# Patient Record
Sex: Female | Born: 1980 | Race: White | Hispanic: No | Marital: Married | Smoking: Never smoker
Health system: Southern US, Community
[De-identification: ages and names within clinical notes are randomized; demographics above are authoritative.]

## PROBLEM LIST (undated history)

## (undated) DIAGNOSIS — IMO0001 Reserved for inherently not codable concepts without codable children: Secondary | ICD-10-CM

## (undated) DIAGNOSIS — L409 Psoriasis, unspecified: Secondary | ICD-10-CM

## (undated) DIAGNOSIS — G43909 Migraine, unspecified, not intractable, without status migrainosus: Secondary | ICD-10-CM

## (undated) HISTORY — DX: Reserved for inherently not codable concepts without codable children: IMO0001

## (undated) HISTORY — PX: NO PAST SURGERIES: SHX2092

## (undated) HISTORY — DX: Psoriasis, unspecified: L40.9

---

## 2010-05-30 ENCOUNTER — Inpatient Hospital Stay (HOSPITAL_COMMUNITY)
Admission: AD | Admit: 2010-05-30 | Payer: BC Managed Care – PPO | Source: Ambulatory Visit | Admitting: Obstetrics and Gynecology

## 2010-06-03 ENCOUNTER — Inpatient Hospital Stay (HOSPITAL_COMMUNITY)
Admission: AD | Admit: 2010-06-03 | Discharge: 2010-06-06 | DRG: 373 | Disposition: A | Discharge status: Home / Self Care | Payer: BC Managed Care – PPO | Source: Ambulatory Visit | Attending: Obstetrics and Gynecology | Admitting: Obstetrics and Gynecology

## 2010-06-03 DIAGNOSIS — O480 Post-term pregnancy: Principal | ICD-10-CM | POA: Diagnosis present

## 2010-06-03 LAB — CBC
MCH: 30.2 pg (ref 26.0–34.0)
MCHC: 34.2 g/dL (ref 30.0–36.0)
RDW: 16.1 % — ABNORMAL HIGH (ref 11.5–15.5)

## 2010-06-04 LAB — RPR: RPR Ser Ql: NONREACTIVE

## 2010-06-05 LAB — CBC
MCHC: 32.9 g/dL (ref 30.0–36.0)
MCV: 89.8 fL (ref 78.0–100.0)
Platelets: 186 10*3/uL (ref 150–400)
RDW: 16.9 % — ABNORMAL HIGH (ref 11.5–15.5)
WBC: 15 10*3/uL — ABNORMAL HIGH (ref 4.0–10.5)

## 2010-09-28 ENCOUNTER — Encounter: Payer: Self-pay | Admitting: *Deleted

## 2010-09-28 DIAGNOSIS — H109 Unspecified conjunctivitis: Secondary | ICD-10-CM | POA: Insufficient documentation

## 2010-09-28 DIAGNOSIS — H5789 Other specified disorders of eye and adnexa: Secondary | ICD-10-CM | POA: Insufficient documentation

## 2010-09-28 NOTE — ED Notes (Signed)
Pt states that her eyes were draining this a.m. and itch. Has gradually gotten worse today.

## 2010-09-29 ENCOUNTER — Emergency Department (HOSPITAL_BASED_OUTPATIENT_CLINIC_OR_DEPARTMENT_OTHER)
Admission: EM | Admit: 2010-09-29 | Discharge: 2010-09-29 | Disposition: A | Discharge status: Home / Self Care | Payer: BC Managed Care – PPO | Attending: Emergency Medicine | Admitting: Emergency Medicine

## 2010-09-29 DIAGNOSIS — H109 Unspecified conjunctivitis: Secondary | ICD-10-CM

## 2010-09-29 MED ORDER — NAPHAZOLINE-PHENIRAMINE 0.025-0.3 % OP SOLN: 1.0000 [drp] | Freq: Four times a day (QID) | OPHTHALMIC | Status: AC

## 2010-09-29 MED ORDER — SULFACETAMIDE SODIUM 10 % OP SOLN: 2.0000 [drp] | OPHTHALMIC | Status: AC

## 2010-09-29 NOTE — ED Provider Notes (Addendum)
History     Chief Complaint  Patient presents with  . Eye Drainage   The history is provided by the patient.  This morning she noticed irritation of both eyes with yellow drainage. There is mild blurred vision when drainage is present. No pain. No rhinorrhea. Nothing makes it better, nothing makes it worse. Symptoms are moderate. She denies sick contacts.  History reviewed. No pertinent past medical history.  History reviewed. No pertinent past surgical history.  History reviewed. No pertinent family history.  History  Substance Use Topics  . Smoking status: Never Smoker   . Smokeless tobacco: Not on file  . Alcohol Use: No    OB History    Grav Para Term Preterm Abortions TAB SAB Ect Mult Living                  Review of Systems  All other systems reviewed and are negative.    Physical Exam  BP 122/75  Pulse 78  Temp(Src) 98.6 F (37 C) (Oral)  Resp 20  Ht 5\' 8"  (1.727 m)  Wt 206 lb (93.441 kg)  BMI 31.32 kg/m2  SpO2 100%  LMP 09/15/2010  Physical Exam  Nursing note and vitals reviewed. Constitutional: She is oriented to person, place, and time. She appears well-developed and well-nourished. No distress.  HENT:  Head: Normocephalic and atraumatic.  Right Ear: External ear normal.  Left Ear: External ear normal.  Nose: Nose normal.  Mouth/Throat: Oropharynx is clear and moist.  Eyes: EOM are normal. Pupils are equal, round, and reactive to light.       Mild bilateral conjunctival injection. Anterior chamber is clear.  Neck: Normal range of motion. Neck supple. No JVD present.  Cardiovascular: Normal rate, regular rhythm and normal heart sounds.   No murmur heard. Pulmonary/Chest: Effort normal and breath sounds normal. She has no wheezes. She has no rales.  Abdominal: Soft. Bowel sounds are normal. She exhibits no mass. There is no tenderness.  Musculoskeletal: Normal range of motion. She exhibits no edema.  Lymphadenopathy:    She has no cervical  adenopathy.  Neurological: She is alert and oriented to person, place, and time. No cranial nerve deficit. Coordination normal.  Skin: Skin is warm and dry. No rash noted.  Psychiatric: She has a normal mood and affect.    ED Course  Procedures  MDM Conjuctivitis - will treat for both allergic and bacterial.      Dione Booze, MD 09/29/10 0159  Dione Booze, MD 11/05/10 1610  Dione Booze, MD 11/07/10 [phone]

## 2011-08-06 ENCOUNTER — Ambulatory Visit (INDEPENDENT_AMBULATORY_CARE_PROVIDER_SITE_OTHER): Payer: BC Managed Care – PPO | Admitting: Family Medicine

## 2011-08-06 VITALS — BP 121/70 | HR 64 | Temp 98.4°F | Resp 16 | Ht 68.0 in | Wt 198.0 lb

## 2011-08-06 DIAGNOSIS — L309 Dermatitis, unspecified: Secondary | ICD-10-CM

## 2011-08-06 DIAGNOSIS — L259 Unspecified contact dermatitis, unspecified cause: Secondary | ICD-10-CM

## 2011-08-06 MED ORDER — TRIAMCINOLONE ACETONIDE 0.1 % EX CREA: TOPICAL_CREAM | Freq: Two times a day (BID) | CUTANEOUS | Status: AC

## 2011-08-06 NOTE — Patient Instructions (Signed)

## 2011-08-06 NOTE — Progress Notes (Signed)
Subjective: For about 3 weeks the patient noticed a rash on right elbow. She does not have any history of eczema or psoriasis. She does not know the cause of the rash.  She is from [place], has been in the [place] for about 8 years, [place].  Objective: Flaky plaque-like lesion on the right elbow. There is a tiny trace not that looks similar on the left elbow. No rash at the base of the sacrum.  Assessment: Eczema(possible psoriasis)  Plan: Triamcinolone cream

## 2012-01-25 ENCOUNTER — Ambulatory Visit (INDEPENDENT_AMBULATORY_CARE_PROVIDER_SITE_OTHER): Payer: BC Managed Care – PPO | Admitting: Family Medicine

## 2012-01-25 VITALS — BP 91/58 | HR 105 | Temp 97.8°F | Resp 18 | Ht 68.0 in | Wt 189.0 lb

## 2012-01-25 DIAGNOSIS — J0100 Acute maxillary sinusitis, unspecified: Secondary | ICD-10-CM

## 2012-01-25 DIAGNOSIS — R059 Cough, unspecified: Secondary | ICD-10-CM

## 2012-01-25 DIAGNOSIS — J111 Influenza due to unidentified influenza virus with other respiratory manifestations: Secondary | ICD-10-CM

## 2012-01-25 DIAGNOSIS — R6889 Other general symptoms and signs: Secondary | ICD-10-CM

## 2012-01-25 DIAGNOSIS — R05 Cough: Secondary | ICD-10-CM

## 2012-01-25 LAB — POCT INFLUENZA A/B
Influenza A, POC: NEGATIVE
Influenza B, POC: NEGATIVE

## 2012-01-25 MED ORDER — BENZONATATE 100 MG PO CAPS: 200.0000 mg | ORAL_CAPSULE | Freq: Two times a day (BID) | ORAL | Status: AC | PRN

## 2012-01-25 MED ORDER — AZITHROMYCIN 250 MG PO TABS: ORAL_TABLET | ORAL | Status: DC

## 2012-01-25 MED ORDER — HYDROCODONE-HOMATROPINE 5-1.5 MG/5ML PO SYRP: 5.0000 mL | ORAL_SOLUTION | Freq: Every evening | ORAL | Status: DC | PRN

## 2012-01-25 NOTE — Progress Notes (Signed)
  [facility]:  Office Visit  Chief Complaint:  Chief Complaint  Patient presents with  . URI    chest congestion/pain fever vomiting fatigue 24 hrs ago    HPI: Melissa [name] is a 31 y.o. female who complains of  1 day history of chest congestion, works at [employer], worked from 6 am to 3:30 am and outside breathing fumes. Has sore throat. Body was weak, msk tired and chills. Subjective fevers, green productive cough.  Has one child 17 mos, has chronic croup. Has tried Advil for fever. Has gotten the flu vaccine week. + nausea and nonbloody vomitus x 1.   History reviewed. No pertinent past medical history. History reviewed. No pertinent past surgical history. History   Social History  . Marital Status: Married    Spouse Name: N/A    Number of Children: N/A  . Years of Education: N/A   Social History Main Topics  . Smoking status: Never Smoker   . Smokeless tobacco: Never Used  . Alcohol Use: No  . Drug Use: No  . Sexually Active: Yes    Birth Control/ Protection: Pill   Other Topics Concern  . None   Social History Narrative  . None   No family history on file. No Known Allergies Prior to Admission medications   Medication Sig Start Date End Date Taking? Authorizing Provider  norethindrone (MICRONOR,CAMILA,ERRIN) 0.35 MG tablet Take 1 tablet by mouth daily.      Historical Provider, MD  triamcinolone cream (KENALOG) 0.1 % Apply topically 2 (two) times daily. 08/06/11 08/05/12  Peyton Najjar, MD     ROS: The patient denies night sweats, unintentional weight loss, chest pain, palpitations, wheezing, dyspnea on exertion, nausea, vomiting, abdominal pain, dysuria, hematuria, melena, numbness, weakness, or tingling.   All other systems have been reviewed and were otherwise negative with the exception of those mentioned in the HPI and as above.    PHYSICAL EXAM: Filed Vitals:   01/25/12 1150  BP: 91/58  Pulse: 105  Temp: 97.8 F (36.6 C)  Resp: 18    Filed Vitals:   01/25/12 1150  Height: 5\' 8"  (1.727 m)  Weight: 189 lb (85.73 kg)   Body mass index is 28.74 kg/(m^2).  General: Alert, no acute distress HEENT:  Normocephalic, atraumatic, oropharynx patent. No exudates, TM nl. + sinus tenderness, right greater than left Cardiovascular:  Sinus tach, Regular rhythm, no rubs murmurs or gallops.  No Carotid bruits, radial pulse intact. No pedal edema.  Respiratory: Clear to auscultation bilaterally.  No wheezes, rales, or rhonchi.  No cyanosis, no use of accessory musculature GI: No organomegaly, abdomen is soft and non-tender, positive bowel sounds.  No masses. Skin: No rashes. Neurologic: Facial musculature symmetric. Psychiatric: Patient is appropriate throughout our interaction. Lymphatic: No cervical lymphadenopathy Musculoskeletal: Gait intact.   LABS: Results for orders placed in visit on 01/25/12  POCT INFLUENZA A/B      Component Value Range   Influenza A, POC Negative     Influenza B, POC Negative       EKG/XRAY:   Primary read interpreted by Dr. Conley Rolls at [facility].   ASSESSMENT/PLAN: Encounter Diagnoses  Name Primary?  . Flu-like symptoms Yes  . Cough   . Sinusitis, acute maxillary    Rx Tessalon perles, hydromet syrup for sxs treatment If no improvement then may take Azithromycin  F/u prn   LE, THAO PHUONG, DO 01/25/2012 12:37 PM

## 2012-05-16 ENCOUNTER — Encounter (HOSPITAL_BASED_OUTPATIENT_CLINIC_OR_DEPARTMENT_OTHER): Payer: Self-pay | Admitting: *Deleted

## 2012-05-16 ENCOUNTER — Emergency Department (HOSPITAL_BASED_OUTPATIENT_CLINIC_OR_DEPARTMENT_OTHER)
Admission: EM | Admit: 2012-05-16 | Discharge: 2012-05-16 | Disposition: A | Discharge status: Home / Self Care | Payer: Worker's Compensation | Attending: Emergency Medicine | Admitting: Emergency Medicine

## 2012-05-16 DIAGNOSIS — Y9289 Other specified places as the place of occurrence of the external cause: Secondary | ICD-10-CM | POA: Insufficient documentation

## 2012-05-16 DIAGNOSIS — Y9389 Activity, other specified: Secondary | ICD-10-CM | POA: Insufficient documentation

## 2012-05-16 DIAGNOSIS — Z79899 Other long term (current) drug therapy: Secondary | ICD-10-CM | POA: Insufficient documentation

## 2012-05-16 DIAGNOSIS — X503XXA Overexertion from repetitive movements, initial encounter: Secondary | ICD-10-CM | POA: Insufficient documentation

## 2012-05-16 DIAGNOSIS — M5431 Sciatica, right side: Secondary | ICD-10-CM

## 2012-05-16 DIAGNOSIS — M5430 Sciatica, unspecified side: Secondary | ICD-10-CM | POA: Insufficient documentation

## 2012-05-16 MED ORDER — OXYCODONE-ACETAMINOPHEN 5-325 MG PO TABS: 1.0000 | ORAL_TABLET | ORAL | Status: DC | PRN

## 2012-05-16 NOTE — ED Notes (Signed)
Pt works for [employer]- hurt her low back 2 days ago while lifting pt- has been evaluated but tonight has increasing pain- taking flexeril and voltaren without relief

## 2012-05-16 NOTE — ED Notes (Signed)
Pt given worker comp forms by Press photographer JB prior to discharge

## 2012-05-16 NOTE — ED Notes (Signed)
Pt reports no lost of control bowel and bladder

## 2012-05-16 NOTE — ED Provider Notes (Signed)
History  This chart was scribed for Carleene Cooper III, MD by Shari Heritage, ED Scribe. The patient was seen in room MH03/MH03. Patient's care was started at 1952.   CSN: [id number]  Arrival date & time 05/16/12  1916   First MD Initiated Contact with Patient 05/16/12 1952      Chief Complaint  Patient presents with  . Back Pain    The history is provided by the patient. A language interpreter was used.     HPI Comments: Melissa [name] is a 32 y.o. female who presents to the Emergency Department complaining of worsening, constant right lower back pain that radiates down her right leg onset 2 days ago. 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 Patient has been evaluated for this pain, but states that today it has worsened. She has been taking Flexeril 10 mg and Voltaren 75 mg without relief. She says that she was cleared to continue working, but patient is unable to stand for the periods of time required for her job duties. Patient also states that sitting for more than 15 minutes also increases pain. There is no bowel or bladder incontinence. Patient has no history of chronic back pain or injury. She has no surgical history. She is not allergic to any medicines. Patient does not smoke or use alcohol.    History reviewed. No pertinent past medical history.  History reviewed. No pertinent past surgical history.  No family history on file.  History  Substance Use Topics  . Smoking status: Never Smoker   . Smokeless tobacco: Never Used  . Alcohol Use: No    OB History   Grav Para Term Preterm Abortions TAB SAB Ect Mult Living                  Review of Systems  Gastrointestinal:       Negative for bowel incontinence.  Genitourinary:       Negative for bladder incontinence.  Musculoskeletal: Positive for back pain.    Allergies  Review of patient's allergies indicates no known  allergies.  Home Medications   Current Outpatient Rx  Name  Route  Sig  Dispense  Refill  . cyclobenzaprine (FLEXERIL) 10 MG tablet   Oral   Take 10 mg by mouth at bedtime as needed for muscle spasms.         . diclofenac (VOLTAREN) 75 MG EC tablet   Oral   Take 75 mg by mouth every 6 (six) hours.         . norethindrone (MICRONOR,CAMILA,ERRIN) 0.35 MG tablet   Oral   Take 1 tablet by mouth daily.           Marland Kitchen triamcinolone cream (KENALOG) 0.1 %   Topical   Apply topically 2 (two) times daily.   30 g   0   . azithromycin (ZITHROMAX) 250 MG tablet      Take 2 tabs po now and then 1 tab po daily for the next 4 days.   6 tablet   0   . HYDROcodone-homatropine (HYCODAN) 5-1.5 MG/5ML syrup   Oral   Take 5 mLs by mouth at bedtime as needed for cough.   120 mL   0     Triage Vitals: BP 113/63  Pulse 76  Temp(Src) 97.9 F (36.6 C) (Oral)  Resp 18  SpO2 99%  LMP 05/02/2012  Physical Exam  Constitutional: She  is oriented to person, place, and time. She appears well-developed and well-nourished.  HENT:  Head: Normocephalic and atraumatic.  Neck: Normal range of motion. Neck supple.  Cardiovascular: Normal rate, regular rhythm and normal heart sounds.   Pulmonary/Chest: Effort normal and breath sounds normal.  Abdominal: Soft. Bowel sounds are normal. There is no tenderness.  Musculoskeletal: Normal range of motion. She exhibits tenderness.  Localizes pain to right sacroiliac joint. No palpable deformity. No numbness or deformity of the right leg.   Neurological: She is alert and oriented to person, place, and time.  Skin: Skin is warm and dry.  Psychiatric: She has a normal mood and affect. Her behavior is normal.    ED Course  Procedures (including critical care time) DIAGNOSTIC STUDIES: Oxygen Saturation is 99% on room air, normal by my interpretation.    COORDINATION OF CARE: 8:02 PM- Patient informed of current plan for treatment and evaluation and  agrees with plan at this time.   Pt appears to have sciatica.  No indication for imaging.  Continue Flexeril, change to Percocet q4h prn pain.  No work for 3 days.  Advised to followup with Dr. Warrick Parisian, her PCP, in 2-3 days to see if she can return to work.       1. Sciatica neuralgia, right    I personally performed the services described in this documentation, which was scribed in my presence. The recorded information has been reviewed and is accurate.  Osvaldo Human, MD      Carleene Cooper III, MD 05/16/12 2013

## 2012-07-13 ENCOUNTER — Other Ambulatory Visit: Payer: Self-pay | Admitting: Family Medicine

## 2012-07-13 DIAGNOSIS — R102 Pelvic and perineal pain: Secondary | ICD-10-CM

## 2012-07-14 ENCOUNTER — Ambulatory Visit
Admission: RE | Admit: 2012-07-14 | Discharge: 2012-07-14 | Disposition: A | Discharge status: Home / Self Care | Payer: BC Managed Care – PPO | Source: Ambulatory Visit | Attending: Family Medicine | Admitting: Family Medicine

## 2012-07-14 DIAGNOSIS — R102 Pelvic and perineal pain: Secondary | ICD-10-CM

## 2012-11-17 LAB — OB RESULTS CONSOLE ABO/RH: RH Type: NEGATIVE

## 2012-11-17 LAB — OB RESULTS CONSOLE HIV ANTIBODY (ROUTINE TESTING): HIV: NONREACTIVE

## 2012-11-17 LAB — OB RESULTS CONSOLE GC/CHLAMYDIA
Chlamydia: NEGATIVE
Gonorrhea: NEGATIVE

## 2012-11-17 LAB — OB RESULTS CONSOLE RUBELLA ANTIBODY, IGM: RUBELLA: IMMUNE

## 2012-11-17 LAB — OB RESULTS CONSOLE RPR: RPR: NONREACTIVE

## 2012-11-17 LAB — OB RESULTS CONSOLE HEPATITIS B SURFACE ANTIGEN: Hepatitis B Surface Ag: NEGATIVE

## 2012-11-17 LAB — OB RESULTS CONSOLE ANTIBODY SCREEN: Antibody Screen: NEGATIVE

## 2013-03-03 NOTE — L&D Delivery Note (Signed)
Delivery Note  SVD viable female Apgars 689m9 over 2nd degree ML lac.  Placenta delivered spontaneously intact with 3VC and sent to pathology Repair with 2-0 Chromic with good support and hemostasis noted and R/V exam confirms.  PH art was not done.  [place] cord blood was not done.  Mother and baby were doing well.  EBL 300cc  Candice Campavid Tifanie Gardiner, MD

## 2013-05-25 LAB — OB RESULTS CONSOLE GBS: GBS: NEGATIVE

## 2013-06-16 ENCOUNTER — Encounter (HOSPITAL_COMMUNITY): Payer: Self-pay | Admitting: *Deleted

## 2013-06-16 ENCOUNTER — Telehealth (HOSPITAL_COMMUNITY): Payer: Self-pay | Admitting: *Deleted

## 2013-06-16 NOTE — Telephone Encounter (Signed)
Preadmission screen  

## 2013-06-18 ENCOUNTER — Inpatient Hospital Stay (HOSPITAL_COMMUNITY)
Admission: RE | Admit: 2013-06-18 | Discharge: 2013-06-20 | DRG: 775 | Disposition: A | Discharge status: Home / Self Care | Payer: BC Managed Care – PPO | Source: Ambulatory Visit | Attending: Obstetrics and Gynecology | Admitting: Obstetrics and Gynecology

## 2013-06-18 ENCOUNTER — Encounter (HOSPITAL_COMMUNITY): Payer: Self-pay

## 2013-06-18 DIAGNOSIS — O358XX0 Maternal care for other (suspected) fetal abnormality and damage, not applicable or unspecified: Secondary | ICD-10-CM | POA: Diagnosis present

## 2013-06-18 LAB — TYPE AND SCREEN
ABO/RH(D): A NEG
ANTIBODY SCREEN: POSITIVE
DAT, IgG: NEGATIVE

## 2013-06-18 LAB — CBC
HCT: 37.4 % (ref 36.0–46.0)
Hemoglobin: 13.2 g/dL (ref 12.0–15.0)
MCH: 31.1 pg (ref 26.0–34.0)
MCHC: 35.3 g/dL (ref 30.0–36.0)
MCV: 88 fL (ref 78.0–100.0)
PLATELETS: 241 10*3/uL (ref 150–400)
RBC: 4.25 MIL/uL (ref 3.87–5.11)
RDW: 13 % (ref 11.5–15.5)
WBC: 15.2 10*3/uL — ABNORMAL HIGH (ref 4.0–10.5)

## 2013-06-18 LAB — RPR

## 2013-06-18 MED ORDER — LACTATED RINGERS IV SOLN: 500.0000 mL | INTRAVENOUS | Status: DC | PRN

## 2013-06-18 MED ORDER — WITCH HAZEL-GLYCERIN EX PADS: 1.0000 "application " | MEDICATED_PAD | CUTANEOUS | Status: DC | PRN

## 2013-06-18 MED ORDER — ONDANSETRON HCL 4 MG/2ML IJ SOLN: 4.0000 mg | INTRAMUSCULAR | Status: DC | PRN

## 2013-06-18 MED ORDER — ONDANSETRON HCL 4 MG/2ML IJ SOLN: 4.0000 mg | Freq: Four times a day (QID) | INTRAMUSCULAR | Status: DC | PRN

## 2013-06-18 MED ORDER — LIDOCAINE HCL (PF) 1 % IJ SOLN
30.0000 mL | INTRAMUSCULAR | Status: AC | PRN
  Administered 2013-06-18: 30 mL via SUBCUTANEOUS
  Filled 2013-06-18: qty 30

## 2013-06-18 MED ORDER — FLEET ENEMA 7-19 GM/118ML RE ENEM: 1.0000 | ENEMA | RECTAL | Status: DC | PRN

## 2013-06-18 MED ORDER — TETANUS-DIPHTH-ACELL PERTUSSIS 5-2.5-18.5 LF-MCG/0.5 IM SUSP: 0.5000 mL | Freq: Once | INTRAMUSCULAR | Status: DC

## 2013-06-18 MED ORDER — LANOLIN HYDROUS EX OINT: TOPICAL_OINTMENT | CUTANEOUS | Status: DC | PRN

## 2013-06-18 MED ORDER — CITRIC ACID-SODIUM CITRATE 334-500 MG/5ML PO SOLN: 30.0000 mL | ORAL | Status: DC | PRN

## 2013-06-18 MED ORDER — ACETAMINOPHEN 325 MG PO TABS: 650.0000 mg | ORAL_TABLET | ORAL | Status: DC | PRN

## 2013-06-18 MED ORDER — SENNOSIDES-DOCUSATE SODIUM 8.6-50 MG PO TABS
2.0000 | ORAL_TABLET | ORAL | Status: DC
Start: 2013-06-19 — End: 2013-06-20
  Administered 2013-06-19 (×2): 2 via ORAL
  Filled 2013-06-18 (×2): qty 2

## 2013-06-18 MED ORDER — LACTATED RINGERS IV SOLN: INTRAVENOUS | Status: DC

## 2013-06-18 MED ORDER — SIMETHICONE 80 MG PO CHEW: 80.0000 mg | CHEWABLE_TABLET | ORAL | Status: DC | PRN

## 2013-06-18 MED ORDER — ONDANSETRON HCL 4 MG PO TABS: 4.0000 mg | ORAL_TABLET | ORAL | Status: DC | PRN

## 2013-06-18 MED ORDER — IBUPROFEN 600 MG PO TABS: 600.0000 mg | ORAL_TABLET | Freq: Four times a day (QID) | ORAL | Status: DC | PRN

## 2013-06-18 MED ORDER — IBUPROFEN 600 MG PO TABS
600.0000 mg | ORAL_TABLET | Freq: Four times a day (QID) | ORAL | Status: DC
  Administered 2013-06-18 – 2013-06-19 (×6): 600 mg via ORAL
  Filled 2013-06-18 (×8): qty 1

## 2013-06-18 MED ORDER — MEDROXYPROGESTERONE ACETATE 150 MG/ML IM SUSP: 150.0000 mg | INTRAMUSCULAR | Status: DC | PRN

## 2013-06-18 MED ORDER — OXYTOCIN BOLUS FROM INFUSION: 500.0000 mL | INTRAVENOUS | Status: DC

## 2013-06-18 MED ORDER — DIBUCAINE 1 % RE OINT: 1.0000 "application " | TOPICAL_OINTMENT | RECTAL | Status: DC | PRN

## 2013-06-18 MED ORDER — MEASLES, MUMPS & RUBELLA VAC ~~LOC~~ INJ
0.5000 mL | INJECTION | Freq: Once | SUBCUTANEOUS | Status: DC
Start: 2013-06-19 — End: 2013-06-20
  Filled 2013-06-18: qty 0.5

## 2013-06-18 MED ORDER — OXYCODONE-ACETAMINOPHEN 5-325 MG PO TABS: 1.0000 | ORAL_TABLET | ORAL | Status: DC | PRN

## 2013-06-18 MED ORDER — DIPHENHYDRAMINE HCL 25 MG PO CAPS: 25.0000 mg | ORAL_CAPSULE | Freq: Four times a day (QID) | ORAL | Status: DC | PRN

## 2013-06-18 MED ORDER — ZOLPIDEM TARTRATE 5 MG PO TABS: 5.0000 mg | ORAL_TABLET | Freq: Every evening | ORAL | Status: DC | PRN

## 2013-06-18 MED ORDER — BENZOCAINE-MENTHOL 20-0.5 % EX AERO
1.0000 "application " | INHALATION_SPRAY | CUTANEOUS | Status: DC | PRN
  Administered 2013-06-18: 1 via TOPICAL
  Filled 2013-06-18: qty 56

## 2013-06-18 MED ORDER — OXYTOCIN 40 UNITS IN LACTATED RINGERS INFUSION - SIMPLE MED
62.5000 mL/h | INTRAVENOUS | Status: DC
  Administered 2013-06-18: 62.5 mL/h via INTRAVENOUS
  Filled 2013-06-18: qty 1000

## 2013-06-18 MED ORDER — PRENATAL MULTIVITAMIN CH
1.0000 | ORAL_TABLET | Freq: Every day | ORAL | Status: DC
  Administered 2013-06-18: 1 via ORAL
  Filled 2013-06-18 (×2): qty 1

## 2013-06-18 NOTE — H&P (Addendum)
Melissa [name] is a 33 y.o. female presenting for SROM at 0400 and labor sxs at 6am. Was 8cm on presentation   Pregnancy complicated by renal fetal pylectasis with otherwise normal US.  3 days ago had US showing worsening of left side to 24 mm and right with 11mm and was scheduled for induction today but presented in labor.  GBS -. History OB History   Grav Para Term Preterm Abortions TAB SAB Ect Mult Living   2 1 1       1      Past Medical History  Diagnosis Date  . Medical history non-contributory    Past Surgical History  Procedure Laterality Date  . No past surgeries     Family History: family history includes Cancer in her father and mother; Hypertension in her brother and maternal grandmother; Thyroid disease in her father; Ulcers in her brother and father. Social History:  reports that she has never smoked. She has never used smokeless tobacco. She reports that she does not drink alcohol or use illicit drugs.   Prenatal Transfer Tool  Maternal Diabetes: No Genetic Screening: Normal Maternal Ultrasounds/Referrals: Normal Fetal Ultrasounds or other Referrals:  Other: Bilateral renal pylectasis Maternal Substance Abuse:  No Significant Maternal Medications:  None Significant Maternal Lab Results:  None Other Comments:  None  ROS    Blood pressure 119/84, pulse 88, temperature 98.2 F (36.8 C), temperature source Oral, resp. rate 20, height 5\' 8"  (1.727 m), weight 106.142 kg (234 lb), last menstrual period 09/13/2012. Exam Physical Exam  8/c/-3 Prenatal labs: ABO, Rh: A/Negative/-- (09/17 0000) Antibody: Negative (09/17 0000) Rubella: Immune (09/17 0000) RPR: Nonreactive (09/17 0000)  HBsAg: Negative (09/17 0000)  HIV: Non-reactive (09/17 0000)  GBS: Negative (03/25 0000)   Assessment/Plan: IUP at term in active labor anticpate SVD Renal pylectasis with worsening of left side (to 24mm)  Melissa [name] 06/18/2013, 6:38 AM

## 2013-06-18 NOTE — Lactation Note (Signed)
This note was copied from the chart of Melissa [name]. Lactation Consultation Note  Patient Name: Melissa [name] ZOXWR'UToday's Date: 06/18/2013 Reason for consult: Initial assessment Lactation visit made. Baby is skin to skin with mother. She reports he fed well after birth and she is trying to latch him now. Baby is very sleepy and turning away from the breast when LC tried to assist mother. Patient reports a history of delayed and low milk supply with last baby resulting in the baby requiring supplementation to gain weight. Mother breast fed, pumped and added formula for 6 months. Mother was taught hand expression and has colostrum. Mother to call RN/ LC when baby awakes for assessment of the latch. Mom encouraged to feed baby w/feeding cues and discussed signs of cues. Discussed the option of setting a breast pump to stimulate milk supply if baby not feeding well. Mother instructed to ask RN for pump if desires. Mother informed of post-discharge support and given phone number to the lactation department, including services for phone call assistance; out-patient appointments; and breastfeeding support group. List of other breastfeeding resources in the community given in the handout. Encouraged mother to call for problems or concerns related to breastfeeding. LC will follow due to history of low milk supply.  Maternal Data Formula Feeding for Exclusion: No  Feeding Feeding Type: Breast Fed Length of feed: 10 min  LATCH Score/Interventions                      Lactation Tools Discussed/Used     Consult Status Consult Status: Follow-up Follow-up type: In-patient    Melissa [name] 06/18/2013, 4:14 PM

## 2013-06-18 NOTE — Lactation Note (Signed)
This note was copied from the chart of Melissa Alyiah Ogburn. Lactation Consultation Note Follow up visit at 14 hours of age.  Mom and FOB giving baby bottle of 15mls that mom just pumped with their own bottle.  Encouraged to allow baby wide open mouth with flanged lips to encourage breast feeding.   Mom has older child that didn't get enough milk and she had to supplement, so she want to bottle some to make sure baby gets some.  Discussed pumping and encouraged mom to latch baby.  Demonstrated and assisted with burping baby and then mom fed with syringe/finger 1 ml  Colostrum.  Mom to call for assist as needed.    Patient Name: Melissa [name] Reason for consult: Follow-up assessment   Maternal Data    Feeding Feeding Type: Bottle Fed - Breast Milk Length of feed: 5 min  LATCH Score/Interventions                      Lactation Tools Discussed/Used     Consult Status Consult Status: Follow-up Date: 06/19/13 Follow-up type: In-patient    Melissa [name] [name], 8:47 PM

## 2013-06-19 LAB — CBC
HEMATOCRIT: 33.2 % — AB (ref 36.0–46.0)
Hemoglobin: 11 g/dL — ABNORMAL LOW (ref 12.0–15.0)
MCH: 29.9 pg (ref 26.0–34.0)
MCHC: 33.1 g/dL (ref 30.0–36.0)
MCV: 90.2 fL (ref 78.0–100.0)
Platelets: 189 10*3/uL (ref 150–400)
RBC: 3.68 MIL/uL — ABNORMAL LOW (ref 3.87–5.11)
RDW: 13.4 % (ref 11.5–15.5)
WBC: 10.8 10*3/uL — AB (ref 4.0–10.5)

## 2013-06-19 NOTE — Progress Notes (Signed)
Post Partum Day 1 Subjective: no complaints, up ad lib and voiding  Objective: Blood pressure 101/69, pulse 64, temperature 97.9 F (36.6 C), temperature source Oral, resp. rate 18, height 5\' 8"  (1.727 m), weight 106.142 kg (234 lb), last menstrual period 09/13/2012, SpO2 98.00%, unknown if currently breastfeeding.  Physical Exam:  General: alert, cooperative, appears stated age and no distress Lochia: appropriate Uterine Fundus: firm Incision: healing well DVT Evaluation: No evidence of DVT seen on physical exam.   Recent Labs  06/18/13 0540 06/19/13 0550  HGB 13.2 11.0*  HCT 37.4 33.2*    Assessment/Plan: Plan for discharge tomorrow, Breastfeeding and Circumcision prior to discharge if ok with Peds (US and urology today)   LOS: 1 day   Turner DanielsDavid C Tomi Grandpre 06/19/2013, 10:16 AM

## 2013-06-20 ENCOUNTER — Inpatient Hospital Stay (HOSPITAL_COMMUNITY)
Admission: AD | Admit: 2013-06-20 | Payer: BC Managed Care – PPO | Source: Ambulatory Visit | Admitting: Obstetrics and Gynecology

## 2013-06-20 NOTE — Lactation Note (Signed)
This note was copied from the chart of Melissa Dessire Fanara. Lactation Consultation Note Mom stated baby is finally getting the hang of feeding and fed great on the last feeding. BF 30 min. On one side, mom pumped the other and gave 5ml of colostrum. Denied pain during feeding, feeling relieved that baby is eating now. Encouraged to call for assistance if needed. Patient Name: Melissa [name] WUJWJ'XToday's Date: 06/20/2013     Maternal Data    Feeding Feeding Type: Breast Fed Length of feed: 30 min  [facility] Score/Interventions                      Lactation Tools Discussed/Used     Consult Status      Melissa [name] 06/20/2013, 1:55 AM

## 2013-06-20 NOTE — Discharge Summary (Signed)
Obstetric Discharge Summary Reason for Admission: onset of labor Prenatal Procedures: ultrasound Intrapartum Procedures: spontaneous vaginal delivery Postpartum Procedures: none Complications-Operative and Postpartum: 2 degree perineal laceration Hemoglobin  Date Value Ref Range Status  06/19/2013 11.0* 12.0 - 15.0 g/dL Final     HCT  Date Value Ref Range Status  06/19/2013 33.2* 36.0 - 46.0 % Final    Physical Exam:  General: alert and cooperative Lochia: appropriate Uterine Fundus: firm Incision: healing well DVT Evaluation: No evidence of DVT seen on physical exam. Negative Homan's sign. No cords or calf tenderness. No significant calf/ankle edema.  Discharge Diagnoses: Term Pregnancy-delivered  Discharge Information: Date: 06/20/2013 Activity: pelvic rest Diet: routine Medications: PNV and Ibuprofen Condition: stable Instructions: refer to practice specific booklet Discharge to: home   Newborn Data: Live born female  Birth Weight: 8 lb 13.8 oz (4020 g) APGAR: 9, 9  Home with mother.  Judith BlonderCarol G Niara Bunker 06/20/2013, 8:36 AM

## 2014-01-02 ENCOUNTER — Encounter (HOSPITAL_COMMUNITY): Payer: Self-pay

## 2014-01-29 ENCOUNTER — Ambulatory Visit (INDEPENDENT_AMBULATORY_CARE_PROVIDER_SITE_OTHER): Payer: BC Managed Care – PPO | Admitting: Family Medicine

## 2014-01-29 ENCOUNTER — Ambulatory Visit (INDEPENDENT_AMBULATORY_CARE_PROVIDER_SITE_OTHER): Payer: BC Managed Care – PPO

## 2014-01-29 VITALS — BP 106/70 | HR 60 | Temp 97.9°F | Resp 16 | Ht 68.0 in | Wt 228.0 lb

## 2014-01-29 DIAGNOSIS — M779 Enthesopathy, unspecified: Secondary | ICD-10-CM

## 2014-01-29 DIAGNOSIS — M778 Other enthesopathies, not elsewhere classified: Secondary | ICD-10-CM

## 2014-01-29 DIAGNOSIS — M6588 Other synovitis and tenosynovitis, other site: Secondary | ICD-10-CM

## 2014-01-29 DIAGNOSIS — M79671 Pain in right foot: Secondary | ICD-10-CM

## 2014-01-29 MED ORDER — PREDNISONE 20 MG PO TABS: ORAL_TABLET | ORAL | Status: DC

## 2014-01-29 MED ORDER — MELOXICAM 15 MG PO TABS: 15.0000 mg | ORAL_TABLET | Freq: Every day | ORAL | Status: DC

## 2014-01-29 NOTE — Patient Instructions (Signed)

## 2014-01-29 NOTE — Progress Notes (Signed)
    MRN: [id number] DOB: [birth year]  Subjective:   Melissa [name] is a 33 y.o. female presenting for 6 day history of worsening right foot pain. Pain is  5/10 on pain scale, located in mid-dorsal foot, constant, intermittently radiates up to her shin. Patient has had difficulty bearing weight and ambulating secondary to pain, mild swelling. She has had some relief from using ibuprofen 800mg  and icing daily. Denies trauma, erythema, wounds, decreased ROM or sensation. 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 Reports that 2 years ago she experienced similar pain in left foot, was told by her PCP to purchase ortho shoes, patient invested $300 in tailored shoes which help resolve her left foot pain. Denies any other aggravating or relieving factors, no other questions or concerns.  Prior to Admission medications   Medication Sig Start Date End Date Taking? Authorizing Provider  Prenatal Vit-Fe Fumarate-FA (PRENATAL MULTIVITAMIN) TABS tablet Take 1 tablet by mouth daily at 12 noon.   Yes Historical Provider, MD   Allergies  Allergen Reactions  . Clindamycin/Lincomycin     Hives and reddness over her body   Denies past medical history.  Past Surgical History  Procedure Laterality Date  . No past surgeries     ROS As in subjective.   Objective:   Vitals: BP 106/70 mmHg  Pulse 60  Temp(Src) 97.9 F (36.6 C) (Oral)  Resp 16  Ht 5\' 8"  (1.727 m)  Wt 228 lb (103.42 kg)  BMI 34.68 kg/m2  SpO2 99%  LMP 01/19/2014  Physical Exam  Constitutional: She is oriented to person, place, and time and well-developed, well-nourished, and in no distress.  Cardiovascular: Normal rate.   Pulmonary/Chest: Effort normal. No respiratory distress.  Musculoskeletal: Normal range of motion. She exhibits tenderness (over 2-4th metatarsals on dorsal aspect of right foot). She exhibits no edema.  Neurological: She is alert and oriented to person, place, and  time.  Skin: Skin is warm and dry. No rash noted. She is not diaphoretic. No erythema.   [facility] reading (PRIMARY) by  Dr. Milus GlazierLauenstein and PA-Kyler Germer. X-ray of right foot: Normal bony landmarks, joint space. No evidence of fracture or dislocation. Soft tissue unremarkable.  Assessment and Plan :   1. Right foot pain 2. Extensor tendonitis of foot - DG Foot Complete Right; Future - meloxicam (MOBIC) 15 MG tablet; Take 1 tablet (15 mg total) by mouth daily.  Dispense: 30 tablet; Refill: 1 - predniSONE (DELTASONE) 20 MG tablet; Take 2 tablets by mouth daily.  Dispense: 10 tablet; Refill: 0 - Reassured patient that there is no evidence of stress fracture. - will attempt trial of NSAID, prednisone, advised patient to let her supervisor know of her foot pain and modify work activity if possible - return to clinic if symptoms worsen, fail to resolve or as needed   Melissa BambergMario Letisha Yera, PA-C [facility] [phone] 01/29/2014 2:00 PM

## 2014-02-01 ENCOUNTER — Telehealth: Payer: Self-pay

## 2014-02-01 NOTE — Telephone Encounter (Signed)
[facility] called to request medical records for patients last ov, labs and x-ray report.   Called patient to complete ROI forms - She will provide one today.   Dr. Karna DupesF. Wong  -  Fax number is  [phone]  Phone  [phone]

## 2014-02-02 NOTE — Telephone Encounter (Signed)
Request received. Medical records will process.

## 2014-05-15 ENCOUNTER — Encounter: Payer: Self-pay | Admitting: Diagnostic Neuroimaging

## 2014-05-15 ENCOUNTER — Ambulatory Visit (INDEPENDENT_AMBULATORY_CARE_PROVIDER_SITE_OTHER): Payer: BLUE CROSS/BLUE SHIELD | Admitting: Diagnostic Neuroimaging

## 2014-05-15 VITALS — BP 114/80 | HR 76 | Wt 228.0 lb

## 2014-05-15 DIAGNOSIS — R519 Headache, unspecified: Secondary | ICD-10-CM

## 2014-05-15 DIAGNOSIS — G43109 Migraine with aura, not intractable, without status migrainosus: Secondary | ICD-10-CM | POA: Diagnosis not present

## 2014-05-15 DIAGNOSIS — H539 Unspecified visual disturbance: Secondary | ICD-10-CM

## 2014-05-15 DIAGNOSIS — R51 Headache: Secondary | ICD-10-CM | POA: Diagnosis not present

## 2014-05-15 NOTE — Patient Instructions (Signed)

## 2014-05-15 NOTE — Progress Notes (Signed)
[facility]  PATIENT: Melissa [name] Dech DOB: 01/21/1981  REFERRING CLINICIAN: Karna DupesF Wong, MD HISTORY FROM: patient (and EPIC chart review) REASON FOR VISIT: new consult    HISTORICAL  CHIEF COMPLAINT:  Chief Complaint  Patient presents with  . New Evaluation    migraine with visual disturbances in right eye    HISTORY OF PRESENT ILLNESS:   34 year old right-handed female here for consultation at request of Dr. Modesto CharonWong, for evaluation of new onset visual disturbance, headache, neck numbness.  January 2016 patient started a new work schedule, waking up at 3 in the morning, starting work at 4 AM. Patient has 2 small children at home and she typically goes to bed late in the evening. By February 2016, she had one episode of right eye blurred vision, seeing sparkling flashing shapes and lites. Symptoms lasted for 30 minutes and then resolve.  Last surgery patient had another episode, similar with right eye blurred vision, seeing rainbow colors, shapes, triangles, none left eye pressure, then frontal headache with nausea and photophobia and phonophobia. Symptoms last for a few hours and then resolved.  Patient has been to PCP and ophthalmology who found no specific cause. Consideration of migraine has been made and patient referred to me for further evaluation.  Overall patient has had some weight gain over the past 5 years, ranging from 178 up to 228 pounds currently. Generally she has decreased energy. No prior history of migraine. No family history of migraine. No other specific triggering her) factors.    REVIEW OF SYSTEMS: Full 14 system review of systems performed and notable only for weight gain fatigue blurred vision loss of vision eye pain headache shiftwork not asleep decreased energy.  ALLERGIES: Allergies  Allergen Reactions  . Clindamycin/Lincomycin     Hives and reddness over her body    HOME MEDICATIONS: Outpatient Prescriptions Prior to Visit    Medication Sig Dispense Refill  . meloxicam (MOBIC) 15 MG tablet Take 1 tablet (15 mg total) by mouth daily. 30 tablet 1  . predniSONE (DELTASONE) 20 MG tablet Take 2 tablets by mouth daily. 10 tablet 0  . Prenatal Vit-Fe Fumarate-FA (PRENATAL MULTIVITAMIN) TABS tablet Take 1 tablet by mouth daily at 12 noon.     No facility-administered medications prior to visit.    PAST MEDICAL HISTORY: Past Medical History  Diagnosis Date  . Healthy adult     PAST SURGICAL HISTORY: Past Surgical History  Procedure Laterality Date  . No past surgeries      FAMILY HISTORY: Family History  Problem Relation Age of Onset  . Cancer Mother     kidney  . Thyroid disease Father   . Ulcers Father   . Cancer Father     thyroid  . Hypertension Brother   . Ulcers Brother   . Hypertension Maternal Grandmother     SOCIAL HISTORY:  History   Social History  . Marital Status: Married    Spouse Name: Velid  . Number of Children: 2  . Years of Education: Bachelors    Occupational History  . Not on file.   Social History Main Topics  . Smoking status: Never Smoker   . Smokeless tobacco: Never Used  . Alcohol Use: No  . Drug Use: No  . Sexual Activity: Yes    Birth Control/ Protection: IUD   Other Topics Concern  . Not on file   Social History Narrative   Lives with husband, mom and two kids (boy and girl)  Drinks about 4 cups of coffee a day     PHYSICAL EXAM  Filed Vitals:   05/15/14 1459  BP: 114/80  Pulse: 76  Weight: 228 lb (103.42 kg)    Body mass index is 34.68 kg/(m^2).  No exam data present  No flowsheet data found.  GENERAL EXAM: Patient is in no distress; well developed, nourished and groomed; neck is supple  CARDIOVASCULAR: Regular rate and rhythm, no murmurs, no carotid bruits  NEUROLOGIC: MENTAL STATUS: awake, alert, oriented to person, place and time, recent and remote memory intact, normal attention and concentration, language fluent,  comprehension intact, naming intact, fund of knowledge appropriate CRANIAL NERVE: no papilledema on fundoscopic exam, pupils equal and reactive to light, visual fields full to confrontation, extraocular muscles intact, no nystagmus, facial sensation and strength symmetric, hearing intact, palate elevates symmetrically, uvula midline, shoulder shrug symmetric, tongue midline. MOTOR: normal bulk and tone, full strength in the BUE, BLE SENSORY: normal and symmetric to light touch, pinprick, temperature, vibration  COORDINATION: finger-nose-finger, fine finger movements normal REFLEXES: deep tendon reflexes present and symmetric GAIT/STATION: narrow based gait; able to walk on toes, heels and tandem; romberg is negative    DIAGNOSTIC DATA (LABS, IMAGING, TESTING) - I reviewed patient records, labs, notes, testing and imaging myself where available.  Lab Results  Component Value Date   WBC 10.8* 06/19/2013   HGB 11.0* 06/19/2013   HCT 33.2* 06/19/2013   MCV 90.2 06/19/2013   PLT 189 06/19/2013   No results found for: NA, K, CL, CO2, GLUCOSE, BUN, CREATININE, CALCIUM, PROT, ALBUMIN, AST, ALT, ALKPHOS, BILITOT, GFRNONAA, GFRAA No results found for: CHOL, HDL, LDLCALC, LDLDIRECT, TRIG, CHOLHDL No results found for: ZOXW9U No results found for: VITAMINB12 No results found for: TSH     ASSESSMENT AND PLAN  34 y.o. year old female here with new onset vision changes and new onset headache. Unclear etiology, but could be migraine vs secondary HA. Neuro exam unremarkable.  Ddx: migraine headache vs secondary headache (CNS mass, vascular, autoimmune/inflamm)  PLAN: - MRI brain - monitor sxs; may consider topiramate at next visit it HA/sxs increase - migraine education, headache diary, HA triggers reviewed  Orders Placed This Encounter  Procedures  . MR Brain W Wo Contrast    Return in about 6 weeks (around 06/26/2014).    Suanne Marker, MD 05/15/2014, 4:06 PM Certified in  Neurology, Neurophysiology and Neuroimaging  [facility] [address], [address] [place], [place] [address] [phone]

## 2014-05-18 ENCOUNTER — Ambulatory Visit (INDEPENDENT_AMBULATORY_CARE_PROVIDER_SITE_OTHER): Payer: BLUE CROSS/BLUE SHIELD

## 2014-05-18 DIAGNOSIS — R51 Headache: Secondary | ICD-10-CM | POA: Diagnosis not present

## 2014-05-18 DIAGNOSIS — G43109 Migraine with aura, not intractable, without status migrainosus: Secondary | ICD-10-CM

## 2014-05-18 DIAGNOSIS — R519 Headache, unspecified: Secondary | ICD-10-CM

## 2014-05-18 DIAGNOSIS — H539 Unspecified visual disturbance: Secondary | ICD-10-CM | POA: Diagnosis not present

## 2014-05-18 MED ORDER — GADOPENTETATE DIMEGLUMINE 469.01 MG/ML IV SOLN: 20.0000 mL | Freq: Once | INTRAVENOUS | Status: AC | PRN

## 2014-06-26 ENCOUNTER — Ambulatory Visit: Payer: BLUE CROSS/BLUE SHIELD | Admitting: Diagnostic Neuroimaging

## 2014-06-27 ENCOUNTER — Telehealth: Payer: Self-pay | Admitting: *Deleted

## 2014-06-27 ENCOUNTER — Ambulatory Visit (INDEPENDENT_AMBULATORY_CARE_PROVIDER_SITE_OTHER): Payer: BLUE CROSS/BLUE SHIELD | Admitting: Diagnostic Neuroimaging

## 2014-06-27 ENCOUNTER — Encounter: Payer: Self-pay | Admitting: Diagnostic Neuroimaging

## 2014-06-27 VITALS — BP 106/64 | HR 59 | Ht 68.0 in | Wt 223.4 lb

## 2014-06-27 DIAGNOSIS — G43109 Migraine with aura, not intractable, without status migrainosus: Secondary | ICD-10-CM | POA: Diagnosis not present

## 2014-06-27 MED ORDER — TOPIRAMATE 50 MG PO TABS: 50.0000 mg | ORAL_TABLET | Freq: Two times a day (BID) | ORAL | Status: DC

## 2014-06-27 MED ORDER — RIZATRIPTAN BENZOATE 10 MG PO TBDP: 10.0000 mg | ORAL_TABLET | ORAL | Status: DC | PRN

## 2014-06-27 NOTE — Telephone Encounter (Signed)
Form,F[employer]a [employer] sent to [place] Village[place] and Dr Marjory LiesPenumalli 06/27/14.

## 2014-06-27 NOTE — Progress Notes (Signed)
[facility]  PATIENT: Melissa [name] DOB: October 01, 1980  REFERRING CLINICIAN: Karna Dupes, MD HISTORY FROM: patient (and EPIC chart review) REASON FOR VISIT: follow up    HISTORICAL  CHIEF COMPLAINT:  Chief Complaint  Patient presents with  . Follow-up    visual disturbance    HISTORY OF PRESENT ILLNESS:   UPDATE 06/27/14: Since last visit, still with daily HA, early AM. No more visual aura since getting new glasses.   PRIOR HPI (05/15/14): 34 year old right-handed female here for consultation at request of Dr. Modesto Charon, for evaluation of new onset visual disturbance, headache, neck numbness. January 2016 patient started a new work schedule, waking up at 3 in the morning, starting work at 4 AM. Patient has 2 small children at home and she typically goes to bed late in the evening. By February 2016, she had one episode of right eye blurred vision, seeing sparkling flashing shapes and lites. Symptoms lasted for 30 minutes and then resolve. Last surgery patient had another episode, similar with right eye blurred vision, seeing rainbow colors, shapes, triangles, none left eye pressure, then frontal headache with nausea and photophobia and phonophobia. Symptoms last for a few hours and then resolved. Patient has been to PCP and ophthalmology who found no specific cause. Consideration of migraine has been made and patient referred to me for further evaluation. Overall patient has had some weight gain over the past 5 years, ranging from 178 up to 228 pounds currently. Generally she has decreased energy. No prior history of migraine. No family history of migraine. No other specific triggering her) factors.   REVIEW OF SYSTEMS: Full 14 system review of systems performed and notable only for memory loss headache numbness light sens shortness of breath chest tightness back pain muscle cramps neck pain neck stiffness.   ALLERGIES: Allergies  Allergen Reactions  . Clindamycin/Lincomycin       Hives and reddness over her body    HOME MEDICATIONS: Outpatient Prescriptions Prior to Visit  Medication Sig Dispense Refill  . ibuprofen (ADVIL,MOTRIN) 200 MG tablet Take 200 mg by mouth every 6 (six) hours as needed.    . butalbital-acetaminophen-caffeine (FIORICET, ESGIC) 50-325-40 MG per tablet   0  . CHERATUSSIN AC 100-10 MG/5ML syrup   0   No facility-administered medications prior to visit.    PAST MEDICAL HISTORY: Past Medical History  Diagnosis Date  . Healthy adult     PAST SURGICAL HISTORY: Past Surgical History  Procedure Laterality Date  . No past surgeries      FAMILY HISTORY: Family History  Problem Relation Age of Onset  . Cancer Mother     kidney  . Thyroid disease Father   . Ulcers Father   . Cancer Father     thyroid  . Hypertension Brother   . Ulcers Brother   . Hypertension Maternal Grandmother     SOCIAL HISTORY:  History   Social History  . Marital Status: Married    Spouse Name: Velid  . Number of Children: 2  . Years of Education: Bachelors    Occupational History  . Not on file.   Social History Main Topics  . Smoking status: Never Smoker   . Smokeless tobacco: Never Used  . Alcohol Use: No  . Drug Use: No  . Sexual Activity: Yes    Birth Control/ Protection: IUD   Other Topics Concern  . Not on file   Social History Narrative   Lives with husband, mom and two kids (boy  and girl)   Drinks about 4 cups of coffee a day     PHYSICAL EXAM  Filed Vitals:   06/27/14 1323  BP: 106/64  Pulse: 59  Height: 5\' 8"  (1.727 m)  Weight: 223 lb 6.4 oz (101.334 kg)    Body mass index is 33.98 kg/(m^2).  No exam data present  No flowsheet data found.  GENERAL EXAM: Patient is in no distress; well developed, nourished and groomed; neck is supple  CARDIOVASCULAR: Regular rate and rhythm, no murmurs, no carotid bruits  NEUROLOGIC: MENTAL STATUS: awake, alert, language fluent, comprehension intact, naming intact, fund  of knowledge appropriate CRANIAL NERVE: pupils equal and reactive to light, visual fields full to confrontation, extraocular muscles intact, no nystagmus, facial sensation and strength symmetric, hearing intact, palate elevates symmetrically, uvula midline, shoulder shrug symmetric, tongue midline. MOTOR: normal bulk and tone, full strength in the BUE, BLE SENSORY: normal and symmetric to light touch  COORDINATION: finger-nose-finger normal REFLEXES: deep tendon reflexes present and symmetric GAIT/STATION: narrow based gait; TANDEM STABLE, romberg is negative    DIAGNOSTIC DATA (LABS, IMAGING, TESTING) - I reviewed patient records, labs, notes, testing and imaging myself where available.  Lab Results  Component Value Date   WBC 10.8* 06/19/2013   HGB 11.0* 06/19/2013   HCT 33.2* 06/19/2013   MCV 90.2 06/19/2013   PLT 189 06/19/2013   No results found for: NA, K, CL, CO2, GLUCOSE, BUN, CREATININE, CALCIUM, PROT, ALBUMIN, AST, ALT, ALKPHOS, BILITOT, GFRNONAA, GFRAA No results found for: CHOL, HDL, LDLCALC, LDLDIRECT, TRIG, CHOLHDL No results found for: [id number] No results found for: VITAMINB12 No results found for: TSH   05/18/14 MRI BRAIN - normal      ASSESSMENT AND PLAN  34 y.o. year old female here with new onset vision changes and new onset headache, likely migraine with aura. Neuro exam and MRI brain unremarkable.  Dx: migraine headache with aura   PLAN: - start topiramate + rizatriptan - agree with patient trying to change schedule to more regular daytime hours  Meds ordered this encounter  Medications  . topiramate (TOPAMAX) 50 MG tablet    Sig: Take 1 tablet (50 mg total) by mouth 2 (two) times daily.    Dispense:  60 tablet    Refill:  12  . rizatriptan (MAXALT-MLT) 10 MG disintegrating tablet    Sig: Take 1 tablet (10 mg total) by mouth as needed for migraine. May repeat in 2 hours if needed    Dispense:  9 tablet    Refill:  6   Return in about 3 months  (around 09/26/2014).    Suanne MarkerVIKRAM R. Kinga Cassar, MD 06/27/2014, 1:51 PM Certified in Neurology, Neurophysiology and Neuroimaging  [facility] [address], [address] [place], [place] [id number] [phone]

## 2014-06-27 NOTE — Patient Instructions (Signed)
Start topiramate 50mg  at bedtime. After 1-2 weeks increase to twice a day.  Use rizatriptan as needed. Max 2 tabs per day and 8 tabs per month.

## 2014-06-28 DIAGNOSIS — Z0289 Encounter for other administrative examinations: Secondary | ICD-10-CM

## 2014-06-28 NOTE — Telephone Encounter (Signed)
Called and left a message for the pt and informed her that her paperwork had been filled out and faxed and to call back with any other questions.

## 2014-07-05 ENCOUNTER — Telehealth: Payer: Self-pay | Admitting: *Deleted

## 2014-07-05 NOTE — Telephone Encounter (Signed)
Form,[employer]mla [employer] Samantha faxed 06/28/14.

## 2014-07-18 NOTE — Telephone Encounter (Signed)
Patient would like to speak with Melissa MastSamantha RN regarding the [facility]. Please call and advise.

## 2014-07-18 NOTE — Telephone Encounter (Signed)
Spoke to the pt on the phone who states that the FMLA was denied. She asked if I could redo the form to reflect 5-8 days per month of calling out due to headache pains. She told me that normally she just goes into work and deals with the headache. I told her to bring the form by tomorrow and I would have Dr. Demetrius CharityP addend the FMLA form and fax it and get it back to her. She told me she would be here around 0930 am. She thanked me

## 2014-08-07 NOTE — Telephone Encounter (Addendum)
Patient called stating to fax FMLA and she will pick up forms on Wednesday. Patient can be reached at [phone].

## 2014-08-07 NOTE — Telephone Encounter (Signed)
Called and left a message for the pt, informing her that the paperwork was done. Asked her to call back and let me know if she wanted it faxed or to come pick it up. She is going to call back and let the operators know. If after this time, the FMLA is not approved, she will need to repay the fee and submit entirely new paperwork to complete.

## 2014-08-31 ENCOUNTER — Telehealth: Payer: Self-pay | Admitting: *Deleted

## 2014-08-31 ENCOUNTER — Other Ambulatory Visit: Payer: Self-pay | Admitting: Obstetrics and Gynecology

## 2014-08-31 NOTE — Telephone Encounter (Signed)
Form,[employer] Fmla Received,completed by Lelon MastSamantha and Dr Marjory LiesPenumalli at front desk for patient 08/31/14.

## 2014-09-01 LAB — CYTOLOGY - PAP

## 2014-09-12 ENCOUNTER — Encounter (HOSPITAL_BASED_OUTPATIENT_CLINIC_OR_DEPARTMENT_OTHER): Payer: Self-pay

## 2014-09-12 ENCOUNTER — Emergency Department (HOSPITAL_BASED_OUTPATIENT_CLINIC_OR_DEPARTMENT_OTHER)
Admission: EM | Admit: 2014-09-12 | Discharge: 2014-09-12 | Disposition: A | Discharge status: Home / Self Care | Payer: BLUE CROSS/BLUE SHIELD | Attending: Emergency Medicine | Admitting: Emergency Medicine

## 2014-09-12 DIAGNOSIS — Z79899 Other long term (current) drug therapy: Secondary | ICD-10-CM | POA: Diagnosis not present

## 2014-09-12 DIAGNOSIS — J3489 Other specified disorders of nose and nasal sinuses: Secondary | ICD-10-CM | POA: Diagnosis not present

## 2014-09-12 DIAGNOSIS — R519 Headache, unspecified: Secondary | ICD-10-CM

## 2014-09-12 DIAGNOSIS — G43909 Migraine, unspecified, not intractable, without status migrainosus: Secondary | ICD-10-CM | POA: Diagnosis not present

## 2014-09-12 DIAGNOSIS — R51 Headache: Secondary | ICD-10-CM

## 2014-09-12 HISTORY — DX: Migraine, unspecified, not intractable, without status migrainosus: G43.909

## 2014-09-12 MED ORDER — KETOROLAC TROMETHAMINE 60 MG/2ML IM SOLN
60.0000 mg | Freq: Once | INTRAMUSCULAR | Status: AC
  Administered 2014-09-12: 60 mg via INTRAMUSCULAR
  Filled 2014-09-12: qty 2

## 2014-09-12 MED ORDER — PSEUDOEPHEDRINE HCL 60 MG PO TABS: 60.0000 mg | ORAL_TABLET | ORAL | Status: DC | PRN

## 2014-09-12 MED ORDER — METOCLOPRAMIDE HCL 10 MG PO TABS
10.0000 mg | ORAL_TABLET | Freq: Once | ORAL | Status: AC
  Administered 2014-09-12: 10 mg via ORAL
  Filled 2014-09-12: qty 1

## 2014-09-12 MED ORDER — IBUPROFEN 600 MG PO TABS: 600.0000 mg | ORAL_TABLET | Freq: Four times a day (QID) | ORAL | Status: AC | PRN

## 2014-09-12 MED ORDER — DIPHENHYDRAMINE HCL 25 MG PO CAPS
50.0000 mg | ORAL_CAPSULE | Freq: Once | ORAL | Status: AC
  Administered 2014-09-12: 50 mg via ORAL
  Filled 2014-09-12: qty 2

## 2014-09-12 NOTE — ED Provider Notes (Signed)
CSN: [id number]     Arrival date & time 09/12/14  1227 History   First MD Initiated Contact with Patient 09/12/14 1238     Chief Complaint  Patient presents with  . Headache     (Consider location/radiation/quality/duration/timing/severity/associated sxs/prior Treatment) HPI Melissa [name] is a 34 y.o. female comes in for evaluation of headache. Patient states she has had intermittent headaches since February and is followed by her primary care as well as neurology for this problem. She has an appointment with neurology next Wednesday. Reports MRI done in May that was normal. She reports a frontal headache migrating to the back of head similar to previous headaches. Rated 6/10. She has taken Topamax without relief of her symptoms. She denies any fevers, chills, nausea or vomiting, numbness or unilateral weakness. She denies pregnancy.  Past Medical History  Diagnosis Date  . Healthy adult   . Migraine    Past Surgical History  Procedure Laterality Date  . No past surgeries     Family History  Problem Relation Age of Onset  . Cancer Mother     kidney  . Thyroid disease Father   . Ulcers Father   . Cancer Father     thyroid  . Hypertension Brother   . Ulcers Brother   . Hypertension Maternal Grandmother    History  Substance Use Topics  . Smoking status: Never Smoker   . Smokeless tobacco: Never Used  . Alcohol Use: No   OB History    Gravida Para Term Preterm AB TAB SAB Ectopic Multiple Living   2 2 2       2      Review of Systems A 10 point review of systems was completed and was negative except for pertinent positives and negatives as mentioned in the history of present illness     Allergies  Clindamycin/lincomycin  Home Medications   Prior to Admission medications   Medication Sig Start Date End Date Taking? Authorizing Provider  ibuprofen (ADVIL,MOTRIN) 600 MG tablet Take 1 tablet (600 mg total) by mouth every 6 (six) hours as needed. 09/12/14   Joycie PeekBenjamin  Vita Currin, PA-C  pseudoephedrine (SUDAFED) 60 MG tablet Take 1 tablet (60 mg total) by mouth every 4 (four) hours as needed for congestion. 09/12/14   Joycie PeekBenjamin Eimi Viney, PA-C  rizatriptan (MAXALT-MLT) 10 MG disintegrating tablet Take 1 tablet (10 mg total) by mouth as needed for migraine. May repeat in 2 hours if needed 06/27/14   Suanne MarkerVikram R Penumalli, MD  topiramate (TOPAMAX) 50 MG tablet Take 1 tablet (50 mg total) by mouth 2 (two) times daily. 06/27/14   Vikram R Penumalli, MD   BP 100/54 mmHg  Pulse 58  Temp(Src) 98.3 F (36.8 C) (Oral)  Resp 16  Ht 5\' 8"  (1.727 m)  Wt 218 lb (98.884 kg)  BMI 33.15 kg/m2  SpO2 100% Physical Exam  Constitutional: She is oriented to person, place, and time. She appears well-developed and well-nourished.  HENT:  Head: Normocephalic and atraumatic.  Mouth/Throat: Oropharynx is clear and moist.  Palpation of frontal sinuses reproduces pressure-like sensation related to headache. No overt pain  Eyes: Conjunctivae are normal. Pupils are equal, round, and reactive to light. Right eye exhibits no discharge. Left eye exhibits no discharge. No scleral icterus.  Neck: Neck supple.  Cardiovascular: Normal rate, regular rhythm and normal heart sounds.   Pulmonary/Chest: Effort normal and breath sounds normal. No respiratory distress. She has no wheezes. She has no rales.  Abdominal: Soft. There is no  tenderness.  Musculoskeletal: She exhibits no tenderness.  Neurological: She is alert and oriented to person, place, and time.  Cranial Nerves II-XII grossly intact. Motor and sensation are baseline for patient. Gait is baseline without ataxia.  Skin: Skin is warm and dry. No rash noted.  Psychiatric: She has a normal mood and affect.  Nursing note and vitals reviewed.   ED Course  Procedures (including critical care time) Labs Review Labs Reviewed - No data to display  Imaging Review No results found.   EKG Interpretation None     Meds given in  ED:  Medications  ketorolac (TORADOL) injection 60 mg (60 mg Intramuscular Given 09/12/14 1310)  diphenhydrAMINE (BENADRYL) capsule 50 mg (50 mg Oral Given 09/12/14 1309)  metoCLOPramide (REGLAN) tablet 10 mg (10 mg Oral Given 09/12/14 1309)    New Prescriptions   IBUPROFEN (ADVIL,MOTRIN) 600 MG TABLET    Take 1 tablet (600 mg total) by mouth every 6 (six) hours as needed.   PSEUDOEPHEDRINE (SUDAFED) 60 MG TABLET    Take 1 tablet (60 mg total) by mouth every 4 (four) hours as needed for congestion.   Filed Vitals:   09/12/14 1237 09/12/14 1442  BP: 123/71 100/54  Pulse: 64 58  Temp: 98.3 F (36.8 C)   TempSrc: Oral   Resp: 16 16  Height:  (1.727 m)   Weight: 218 lb (98.884 kg)   SpO2: 100% 100%   MDM  Vitals stable, afebrile Feels better after analgesia in ED. Reports resolution of headache after migraine cocktail. Normal neurological exam. No dizziness. Gait baseline. HA gradual in onset. Similar to previous HA No hx of traumatic injury No hx of clotting disorder, peripartum or postpartum state, Immunocompromise Doubt Meningitis, CVA/SAH/ICH, Dural Venous Thrombosis, Eclampsia, Seizure, Central Lesion or Tumor, Giant Cell Arteritis. I have personally reviewed all labs, imaging, nursing/prvious notes during the patient's evaluation in the ED today. Headache likely multifactorial evidentially due to sinus pressure. Will DC with decongestant. No evidence of other acute or emergent pathology that requires immediate intervention at this time. Pt stable, in good condition and is appropriate for discharge. DC with instructions to follow up with PCP within 48 hrs for further evaluation and management of symptoms.   Final diagnoses:  Nonintractable headache, unspecified chronicity pattern, unspecified headache type       Joycie Peek, PA-C 09/12/14 1503  Blake Divine, MD 09/12/14 1547

## 2014-09-12 NOTE — ED Notes (Signed)
C/o HA with sinus pressure x 7 days-dx with migraine March

## 2014-09-12 NOTE — Discharge Instructions (Signed)
There is not appear to be an emergent cause her headache at this time. It is important to follow-up with your PCP and neurologist for further management of your symptoms. Please take your medications as prescribed to help with her discomfort. Return to ED for worsening symptoms.  Headaches, Frequently Asked Questions MIGRAINE HEADACHES Q: What is migraine? What causes it? How can I treat it? A: Generally, migraine headaches begin as a dull ache. Then they develop into a constant, throbbing, and pulsating pain. You may experience pain at the temples. You may experience pain at the front or back of one or both sides of the head. The pain is usually accompanied by a combination of:  Nausea.  Vomiting.  Sensitivity to light and noise. Some people (about 15%) experience an aura (see below) before an attack. The cause of migraine is believed to be chemical reactions in the brain. Treatment for migraine may include over-the-counter or prescription medications. It may also include self-help techniques. These include relaxation training and biofeedback.  Q: What is an aura? A: About 15% of people with migraine get an "aura". This is a sign of neurological symptoms that occur before a migraine headache. You may see wavy or jagged lines, dots, or flashing lights. You might experience tunnel vision or blind spots in one or both eyes. The aura can include visual or auditory hallucinations (something imagined). It may include disruptions in smell (such as strange odors), taste or touch. Other symptoms include:  Numbness.  A "pins and needles" sensation.  Difficulty in recalling or speaking the correct word. These neurological events may last as long as 60 minutes. These symptoms will fade as the headache begins. Q: What is a trigger? A: Certain physical or environmental factors can lead to or "trigger" a migraine. These include:  Foods.  Hormonal changes.  Weather.  Stress. It is important to  remember that triggers are different for everyone. To help prevent migraine attacks, you need to figure out which triggers affect you. Keep a headache diary. This is a good way to track triggers. The diary will help you talk to your healthcare professional about your condition. Q: Does weather affect migraines? A: Bright sunshine, hot, humid conditions, and drastic changes in barometric pressure may lead to, or "trigger," a migraine attack in some people. But studies have shown that weather does not act as a trigger for everyone with migraines. Q: What is the link between migraine and hormones? A: Hormones start and regulate many of your body's functions. Hormones keep your body in balance within a constantly changing environment. The levels of hormones in your body are unbalanced at times. Examples are during menstruation, pregnancy, or menopause. That can lead to a migraine attack. In fact, about three quarters of all women with migraine report that their attacks are related to the menstrual cycle.  Q: Is there an increased risk of stroke for migraine sufferers? A: The likelihood of a migraine attack causing a stroke is very remote. That is not to say that migraine sufferers cannot have a stroke associated with their migraines. In persons under age 70, the most common associated factor for stroke is migraine headache. But over the course of a person's normal life span, the occurrence of migraine headache may actually be associated with a reduced risk of dying from cerebrovascular disease due to stroke.  Q: What are acute medications for migraine? A: Acute medications are used to treat the pain of the headache after it has started. Examples over-the-counter  medications, NSAIDs, ergots, and triptans.  Q: What are the triptans? A: Triptans are the newest class of abortive medications. They are specifically targeted to treat migraine. Triptans are vasoconstrictors. They moderate some chemical reactions in  the brain. The triptans work on receptors in your brain. Triptans help to restore the balance of a neurotransmitter called serotonin. Fluctuations in levels of serotonin are thought to be a main cause of migraine.  Q: Are over-the-counter medications for migraine effective? A: Over-the-counter, or "OTC," medications may be effective in relieving mild to moderate pain and associated symptoms of migraine. But you should see your caregiver before beginning any treatment regimen for migraine.  Q: What are preventive medications for migraine? A: Preventive medications for migraine are sometimes referred to as "prophylactic" treatments. They are used to reduce the frequency, severity, and length of migraine attacks. Examples of preventive medications include antiepileptic medications, antidepressants, beta-blockers, calcium channel blockers, and NSAIDs (nonsteroidal anti-inflammatory drugs). Q: Why are anticonvulsants used to treat migraine? A: During the past few years, there has been an increased interest in antiepileptic drugs for the prevention of migraine. They are sometimes referred to as "anticonvulsants". Both epilepsy and migraine may be caused by similar reactions in the brain.  Q: Why are antidepressants used to treat migraine? A: Antidepressants are typically used to treat people with depression. They may reduce migraine frequency by regulating chemical levels, such as serotonin, in the brain.  Q: What alternative therapies are used to treat migraine? A: The term "alternative therapies" is often used to describe treatments considered outside the scope of conventional Western medicine. Examples of alternative therapy include acupuncture, acupressure, and yoga. Another common alternative treatment is herbal therapy. Some herbs are believed to relieve headache pain. Always discuss alternative therapies with your caregiver before proceeding. Some herbal products contain arsenic and other toxins. TENSION  HEADACHES Q: What is a tension-type headache? What causes it? How can I treat it? A: Tension-type headaches occur randomly. They are often the result of temporary stress, anxiety, fatigue, or anger. Symptoms include soreness in your temples, a tightening band-like sensation around your head (a "vice-like" ache). Symptoms can also include a pulling feeling, pressure sensations, and contracting head and neck muscles. The headache begins in your forehead, temples, or the back of your head and neck. Treatment for tension-type headache may include over-the-counter or prescription medications. Treatment may also include self-help techniques such as relaxation training and biofeedback. CLUSTER HEADACHES Q: What is a cluster headache? What causes it? How can I treat it? A: Cluster headache gets its name because the attacks come in groups. The pain arrives with little, if any, warning. It is usually on one side of the head. A tearing or bloodshot eye and a runny nose on the same side of the headache may also accompany the pain. Cluster headaches are believed to be caused by chemical reactions in the brain. They have been described as the most severe and intense of any headache type. Treatment for cluster headache includes prescription medication and oxygen. SINUS HEADACHES Q: What is a sinus headache? What causes it? How can I treat it? A: When a cavity in the bones of the face and skull (a sinus) becomes inflamed, the inflammation will cause localized pain. This condition is usually the result of an allergic reaction, a tumor, or an infection. If your headache is caused by a sinus blockage, such as an infection, you will probably have a fever. An x-ray will confirm a sinus blockage. Your caregiver's treatment  might include antibiotics for the infection, as well as antihistamines or decongestants.  REBOUND HEADACHES Q: What is a rebound headache? What causes it? How can I treat it? A: A pattern of taking acute  headache medications too often can lead to a condition known as "rebound headache." A pattern of taking too much headache medication includes taking it more than 2 days per week or in excessive amounts. That means more than the label or a caregiver advises. With rebound headaches, your medications not only stop relieving pain, they actually begin to cause headaches. Doctors treat rebound headache by tapering the medication that is being overused. Sometimes your caregiver will gradually substitute a different type of treatment or medication. Stopping may be a challenge. Regularly overusing a medication increases the potential for serious side effects. Consult a caregiver if you regularly use headache medications more than 2 days per week or more than the label advises. ADDITIONAL QUESTIONS AND ANSWERS Q: What is biofeedback? A: Biofeedback is a self-help treatment. Biofeedback uses special equipment to monitor your body's involuntary physical responses. Biofeedback monitors:  Breathing.  Pulse.  Heart rate.  Temperature.  Muscle tension.  Brain activity. Biofeedback helps you refine and perfect your relaxation exercises. You learn to control the physical responses that are related to stress. Once the technique has been mastered, you do not need the equipment any more. Q: Are headaches hereditary? A: Four out of five (80%) of people that suffer report a family history of migraine. Scientists are not sure if this is genetic or a family predisposition. Despite the uncertainty, a child has a 50% chance of having migraine if one parent suffers. The child has a 75% chance if both parents suffer.  Q: Can children get headaches? A: By the time they reach high school, most young people have experienced some type of headache. Many safe and effective approaches or medications can prevent a headache from occurring or stop it after it has begun.  Q: What type of doctor should I see to diagnose and treat my  headache? A: Start with your primary caregiver. Discuss his or her experience and approach to headaches. Discuss methods of classification, diagnosis, and treatment. Your caregiver may decide to recommend you to a headache specialist, depending upon your symptoms or other physical conditions. Having diabetes, allergies, etc., may require a more comprehensive and inclusive approach to your headache. The National Headache Foundation will provide, upon request, a list of [facility] physician members in your state. Document Released: 05/10/2003 Document Revised: 05/12/2011 Document Reviewed: 10/18/2007 [facility] Patient Information 2015 [place], [place]. This information is not intended to replace advice given to you by your health care provider. Make sure you discuss any questions you have with your health care provider.

## 2014-10-04 ENCOUNTER — Ambulatory Visit (INDEPENDENT_AMBULATORY_CARE_PROVIDER_SITE_OTHER): Payer: BLUE CROSS/BLUE SHIELD | Admitting: Diagnostic Neuroimaging

## 2014-10-04 ENCOUNTER — Encounter: Payer: Self-pay | Admitting: Diagnostic Neuroimaging

## 2014-10-04 VITALS — BP 120/73 | HR 84 | Ht 68.0 in | Wt 215.2 lb

## 2014-10-04 DIAGNOSIS — G43109 Migraine with aura, not intractable, without status migrainosus: Secondary | ICD-10-CM | POA: Diagnosis not present

## 2014-10-04 NOTE — Progress Notes (Signed)
[facility]  PATIENT: Melissa [name] DOB: [birth year]  REFERRING CLINICIAN: Karna Dupes, MD HISTORY FROM: patient (and EPIC chart review) REASON FOR VISIT: follow up    HISTORICAL  CHIEF COMPLAINT:  Chief Complaint  Patient presents with  . Migraine    rm 7  . Follow-up    HISTORY OF PRESENT ILLNESS:   UPDATE 10/04/14: Since last visit, was doing a little better with improved work schedule. Still on TPX 50mg  qhs and rizatriptan prn. Rizatriptan tried 2 times, which helps relieve HA, but causes sedation and fogginess. Then went to ER on July 12 for severe HA with sinus pressure. Now on sinus meds and abx. Avg 1 severe migraine per week.   UPDATE 06/27/14: Since last visit, still with daily HA, early AM. No more visual aura since getting new glasses.   PRIOR HPI (05/15/14): 34 year old right-handed female here for consultation at request of Dr. [place] Melissa, for evaluation of new onset visual disturbance, headache, neck numbness. January 2016 patient started a new work schedule, waking up at 3 in the morning, starting work at 4 AM. Patient has 2 small children at home and she typically goes to bed late in the evening. By February 2016, she had one episode of right eye blurred vision, seeing sparkling flashing shapes and lites. Symptoms lasted for 30 minutes and then resolve. Last surgery patient had another episode, similar with right eye blurred vision, seeing rainbow colors, shapes, triangles, none left eye pressure, then frontal headache with nausea and photophobia and phonophobia. Symptoms last for a few hours and then resolved. Patient has been to PCP and ophthalmology who found no specific cause. Consideration of migraine has been made and patient referred to me for further evaluation. Overall patient has had some weight gain over the past 5 years, ranging from 178 up to 228 pounds currently. Generally she has decreased energy. No prior history of migraine. No family history of  migraine. No other specific triggering her) factors.   REVIEW OF SYSTEMS: Full 14 system review of systems performed and notable only for weakness appetite change excess sweating neck pain heat intolerance.    ALLERGIES: Allergies  Allergen Reactions  . Clindamycin/Lincomycin     Hives and reddness over her body    HOME MEDICATIONS: Outpatient Prescriptions Prior to Visit  Medication Sig Dispense Refill  . ibuprofen (ADVIL,MOTRIN) 600 MG tablet Take 1 tablet (600 mg total) by mouth every 6 (six) hours as needed. 30 tablet 0  . pseudoephedrine (SUDAFED) 60 MG tablet Take 1 tablet (60 mg total) by mouth every 4 (four) hours as needed for congestion. 30 tablet 0  . topiramate (TOPAMAX) 50 MG tablet Take 1 tablet (50 mg total) by mouth 2 (two) times daily. 60 tablet 12  . rizatriptan (MAXALT-MLT) 10 MG disintegrating tablet Take 1 tablet (10 mg total) by mouth as needed for migraine. May repeat in 2 hours if needed (Patient not taking: Reported on 10/04/2014) 9 tablet 6   No facility-administered medications prior to visit.    PAST MEDICAL HISTORY: Past Medical History  Diagnosis Date  . Healthy adult   . Migraine     PAST SURGICAL HISTORY: Past Surgical History  Procedure Laterality Date  . No past surgeries      FAMILY HISTORY: Family History  Problem Relation Age of Onset  . Cancer Mother     kidney  . Thyroid disease Father   . Ulcers Father   . Cancer Father     thyroid  .  Hypertension Brother   . Ulcers Brother   . Hypertension Maternal Grandmother     SOCIAL HISTORY:  History   Social History  . Marital Status: Married    Spouse Name: Velid  . Number of Children: 2  . Years of Education: Bachelors    Occupational History  . Not on file.   Social History Main Topics  . Smoking status: Never Smoker   . Smokeless tobacco: Never Used  . Alcohol Use: No  . Drug Use: No  . Sexual Activity: Yes    Birth Control/ Protection: IUD   Other Topics Concern    . Not on file   Social History Narrative   Lives with husband, mom and two kids (boy and girl)   Drinks about 4 cups of coffee a day     PHYSICAL EXAM  Filed Vitals:   10/04/14 1437  BP: 120/73  Pulse: 84  Height:  (1.727 m)  Weight: 215 lb 3.2 oz (97.614 kg)   Wt Readings from Last 3 Encounters:  10/04/14 215 lb 3.2 oz (97.614 kg)  09/12/14 218 lb (98.884 kg)  06/27/14 223 lb 6.4 oz (101.334 kg)    Body mass index is 32.73 kg/(m^2).  No exam data present  No flowsheet data found.  GENERAL EXAM: Patient is in no distress; well developed, nourished and groomed; neck is supple  CARDIOVASCULAR: Regular rate and rhythm, no murmurs, no carotid bruits  NEUROLOGIC: MENTAL STATUS: awake, alert, language fluent, comprehension intact, naming intact, fund of knowledge appropriate CRANIAL NERVE: pupils equal and reactive to light, visual fields full to confrontation, extraocular muscles intact, no nystagmus, facial sensation and strength symmetric, hearing intact, palate elevates symmetrically, uvula midline, shoulder shrug symmetric, tongue midline. MOTOR: normal bulk and tone, full strength in the BUE, BLE SENSORY: normal and symmetric to light touch  COORDINATION: finger-nose-finger normal REFLEXES: deep tendon reflexes present and symmetric GAIT/STATION: narrow based gait; romberg is negative    DIAGNOSTIC DATA (LABS, IMAGING, TESTING) - I reviewed patient records, labs, notes, testing and imaging myself where available.  Lab Results  Component Value Date   WBC 10.8* 06/19/2013   HGB 11.0* 06/19/2013   HCT 33.2* 06/19/2013   MCV 90.2 06/19/2013   PLT 189 06/19/2013   No results found for: NA, K, CL, CO2, GLUCOSE, BUN, CREATININE, CALCIUM, PROT, ALBUMIN, AST, ALT, ALKPHOS, BILITOT, GFRNONAA, GFRAA No results found for: CHOL, HDL, LDLCALC, LDLDIRECT, TRIG, CHOLHDL No results found for: ZOXW9U No results found for: VITAMINB12 No results found for:  TSH   05/18/14 MRI BRAIN - normal     ASSESSMENT AND PLAN  34 y.o. year old female here with new onset vision changes and new onset headache, likely migraine with aura. Neuro exam and MRI brain unremarkable. Doing better overall, with some set back a few weeks ago (? Sinus related).   Dx: migraine headache with aura  PLAN: - continue topiramate + rizatriptan prn  Return in about 4 months (around 02/03/2015).    Suanne Marker, MD 10/04/2014, 3:02 PM Certified in Neurology, Neurophysiology and Neuroimaging  [facility] [address] [place], [place] [address] [phone]

## 2014-10-04 NOTE — Patient Instructions (Signed)
Continue current medications. 

## 2015-02-14 ENCOUNTER — Ambulatory Visit (INDEPENDENT_AMBULATORY_CARE_PROVIDER_SITE_OTHER): Payer: BLUE CROSS/BLUE SHIELD | Admitting: Diagnostic Neuroimaging

## 2015-02-14 ENCOUNTER — Encounter: Payer: Self-pay | Admitting: Diagnostic Neuroimaging

## 2015-02-14 VITALS — BP 106/67 | HR 68 | Ht 68.0 in | Wt 220.6 lb

## 2015-02-14 DIAGNOSIS — G43109 Migraine with aura, not intractable, without status migrainosus: Secondary | ICD-10-CM

## 2015-02-14 MED ORDER — RIZATRIPTAN BENZOATE 10 MG PO TBDP: 10.0000 mg | ORAL_TABLET | ORAL | Status: DC | PRN

## 2015-02-14 MED ORDER — TOPIRAMATE 50 MG PO TABS: 50.0000 mg | ORAL_TABLET | Freq: Two times a day (BID) | ORAL | Status: DC

## 2015-02-14 NOTE — Progress Notes (Signed)
[facility]  PATIENT: Melissa [name] DOB: 1980-11-24  REFERRING CLINICIAN: Karna Dupes, MD HISTORY FROM: patient REASON FOR VISIT: follow up    HISTORICAL  CHIEF COMPLAINT:  Chief Complaint  Patient presents with  . Follow-up    migraine    HISTORY OF PRESENT ILLNESS:   UPDATE 02/14/15: Since last visit, severe migraine are 2 per month; usually pre-menstrual 2 days. TPX  qhs, tolerable. Rizatriptan helps with HA, but causes drowsiness afterwards for 1-2 days.   UPDATE 10/04/14: Since last visit, was doing a little better with improved work schedule. Still on TPX  qhs and rizatriptan prn. Rizatriptan tried 2 times, which helps relieve HA, but causes sedation and fogginess. Then went to ER on July 12 for severe HA with sinus pressure. Now on sinus meds and abx. Avg 1 severe migraine per week.   UPDATE 06/27/14: Since last visit, still with daily HA, early AM. No more visual aura since getting new glasses.   PRIOR HPI (05/15/14): 34 year old right-handed female here for consultation at request of Dr. Modesto Charon, for evaluation of new onset visual disturbance, headache, neck numbness. January 2016 patient started a new work schedule, waking up at 3 in the morning, starting work at 4 AM. Patient has 2 small children at home and she typically goes to bed late in the evening. By February 2016, she had one episode of right eye blurred vision, seeing sparkling flashing shapes and lites. Symptoms lasted for 30 minutes and then resolve. Last surgery patient had another episode, similar with right eye blurred vision, seeing rainbow colors, shapes, triangles, none left eye pressure, then frontal headache with nausea and photophobia and phonophobia. Symptoms last for a few hours and then resolved. Patient has been to PCP and ophthalmology who found no specific cause. Consideration of migraine has been made and patient referred to me for further evaluation. Overall patient has had some  weight gain over the past 5 years, ranging from 178 up to 228 pounds currently. Generally she has decreased energy. No prior history of migraine. No family history of migraine. No other specific triggering her) factors.   REVIEW OF SYSTEMS: Full 14 system review of systems performed and notable only for light sens neck pain neck stiffness.   ALLERGIES: Allergies  Allergen Reactions  . Clindamycin/Lincomycin     Hives and reddness over her body    HOME MEDICATIONS: Outpatient Prescriptions Prior to Visit  Medication Sig Dispense Refill  . fluticasone (FLONASE) 50 MCG/ACT nasal spray USE 1 SPRAY IN EACH NOSTRIL ONCE A DAY NASALLY  2  . ibuprofen (ADVIL,MOTRIN) 600 MG tablet Take 1 tablet (600 mg total) by mouth every 6 (six) hours as needed. 30 tablet 0  . topiramate (TOPAMAX) 50 MG tablet Take 1 tablet (50 mg total) by mouth 2 (two) times daily. (Patient taking differently: Take 50 mg by mouth daily. ) 60 tablet 12  . rizatriptan (MAXALT-MLT) 10 MG disintegrating tablet Take 1 tablet (10 mg total) by mouth as needed for migraine. May repeat in 2 hours if needed (Patient not taking: Reported on 10/04/2014) 9 tablet 6  . cefdinir (OMNICEF) 300 MG capsule Take 300 mg by mouth 2 (two) times daily.    . pseudoephedrine (SUDAFED) 60 MG tablet Take 1 tablet (60 mg total) by mouth every 4 (four) hours as needed for congestion. 30 tablet 0   No facility-administered medications prior to visit.    PAST MEDICAL HISTORY: Past Medical History  Diagnosis Date  . Healthy adult   .  Migraine     PAST SURGICAL HISTORY: Past Surgical History  Procedure Laterality Date  . No past surgeries      FAMILY HISTORY: Family History  Problem Relation Age of Onset  . Cancer Mother     kidney  . Thyroid disease Father   . Ulcers Father   . Cancer Father     thyroid  . Hypertension Brother   . Ulcers Brother   . Hypertension Maternal Grandmother     SOCIAL HISTORY:  Social History   Social  History  . Marital Status: Married    Spouse Name: Velid  . Number of Children: 2  . Years of Education: Bachelors    Occupational History  . Not on file.   Social History Main Topics  . Smoking status: Never Smoker   . Smokeless tobacco: Never Used  . Alcohol Use: No  . Drug Use: No  . Sexual Activity: Yes    Birth Control/ Protection: IUD   Other Topics Concern  . Not on file   Social History Narrative   Lives with husband, mom and two kids (boy and girl)   Drinks about 4 cups of coffee a day     PHYSICAL EXAM  Filed Vitals:   02/14/15 1313  BP: 106/67  Pulse: 68  Height:  (1.727 m)  Weight: 220 lb 9.6 oz (100.064 kg)   Wt Readings from Last 3 Encounters:  02/14/15 220 lb 9.6 oz (100.064 kg)  10/04/14 215 lb 3.2 oz (97.614 kg)  09/12/14 218 lb (98.884 kg)    Body mass index is 33.55 kg/(m^2).  No exam data present  No flowsheet data found.  GENERAL EXAM: Patient is in no distress; well developed, nourished and groomed; neck is supple  CARDIOVASCULAR: Regular rate and rhythm, no murmurs, no carotid bruits  NEUROLOGIC: MENTAL STATUS: awake, alert, language fluent, comprehension intact, naming intact, fund of knowledge appropriate CRANIAL NERVE: pupils equal and reactive to light, visual fields full to confrontation, extraocular muscles intact, no nystagmus, facial sensation and strength symmetric, hearing intact, palate elevates symmetrically, uvula midline, shoulder shrug symmetric, tongue midline. MOTOR: normal bulk and tone, full strength in the BUE, BLE SENSORY: normal and symmetric to light touch  COORDINATION: finger-nose-finger normal REFLEXES: deep tendon reflexes present and symmetric GAIT/STATION: narrow based gait; romberg is negative    DIAGNOSTIC DATA (LABS, IMAGING, TESTING) - I reviewed patient records, labs, notes, testing and imaging myself where available.  Lab Results  Component Value Date   WBC 10.8* 06/19/2013   HGB 11.0*  06/19/2013   HCT 33.2* 06/19/2013   MCV 90.2 06/19/2013   PLT 189 06/19/2013   No results found for: NA, K, CL, CO2, GLUCOSE, BUN, CREATININE, CALCIUM, PROT, ALBUMIN, AST, ALT, ALKPHOS, BILITOT, GFRNONAA, GFRAA No results found for: CHOL, HDL, LDLCALC, LDLDIRECT, TRIG, CHOLHDL No results found for: ZOXW9U No results found for: VITAMINB12 No results found for: TSH   05/18/14 MRI BRAIN - normal     ASSESSMENT AND PLAN  34 y.o. year old female here with new onset vision changes and new onset headache, likely migraine with aura. Neuro exam and MRI brain unremarkable. Headaches slightly more intense.   Dx: migraine headache with aura   PLAN: - increase topiramate to  BID or  qhs - rizatriptan prn - exercise and nutrition strategies reviewed  Meds ordered this encounter  Medications  . topiramate (TOPAMAX) 50 MG tablet    Sig: Take 1 tablet (50 mg total) by mouth 2 (  two) times daily.    Dispense:  60 tablet    Refill:  12  . rizatriptan (MAXALT-MLT) 10 MG disintegrating tablet    Sig: Take 1 tablet (10 mg total) by mouth as needed for migraine. May repeat in 2 hours if needed    Dispense:  9 tablet    Refill:  6   Return in about 6 months (around 08/15/2015).    Suanne MarkerVIKRAM R. PENUMALLI, MD 02/14/2015, 1:35 PM Certified in Neurology, Neurophysiology and Neuroimaging  [facility] [address], [address] [place], [place] [id number] [phone]

## 2015-02-14 NOTE — Patient Instructions (Signed)
Thank you for coming to see [place] at [facility]. I hope we have been able to provide you high quality care today.  You may receive a patient satisfaction survey over the next few weeks. We would appreciate your feedback and comments so that we may continue to improve ourselves and the health of our patients.  - increase topiramate to 51m twice a day or 107mat bedtime    ~~~~~~~~~~~~~~~~~~~~~~~~~~~~~~~~~~~~~~~~~~~~~~~~~~~~~~~~~~~~~~~~~  DR. Neda Willenbring'S GUIDE TO HAPPY AND HEALTHY LIVING These are some of my general health and wellness recommendations. Some of them may apply to you better than others. Please use common sense as you try these suggestions and feel free to ask me any questions.   ACTIVITY/FITNESS Mental, social, emotional and physical stimulation are very important for brain and body health. Try learning a new activity (arts, music, language, sports, games).  Keep moving your body to the best of your abilities. You can do this at home, inside or outside, the park, community center, gym or anywhere you like. Consider a physical therapist or personal trainer to get started. Consider the app Sworkit. Fitness trackers such as smart-watches, smart-phones or Fitbits can help as well.   NUTRITION Eat more plants: colorful vegetables, nuts, seeds and berries.  Eat less sugar, salt, preservatives and processed foods.  Avoid toxins such as cigarettes and alcohol.  Drink water when you are thirsty. Warm water with a slice of lemon is an excellent morning drink to start the day.  Consider these websites for more information The Nutrition Source (hthttps://www.henry-hernandez.biz/[employer] Nutrition (wwWindowBlog.ch  RELAXATION Consider practicing mindfulness meditation or other relaxation techniques such as deep breathing, prayer, yoga, tai chi, massage. See website mindful.org or the apps Headspace or Calm to help get  started.   SLEEP Try to get at least 7-8+ hours sleep per day. Regular exercise and reduced caffeine will help you sleep better. Practice good sleep hygeine techniques. See website sleep.org for more information.   PLANNING Prepare estate planning, living will, healthcare POA documents. Sometimes this is best planned with the help of an attorney. Theconversationproject.org and agingwithdignity.org are excellent resources.

## 2015-08-22 ENCOUNTER — Encounter: Payer: Self-pay | Admitting: Diagnostic Neuroimaging

## 2015-08-22 ENCOUNTER — Ambulatory Visit (INDEPENDENT_AMBULATORY_CARE_PROVIDER_SITE_OTHER): Payer: BLUE CROSS/BLUE SHIELD | Admitting: Diagnostic Neuroimaging

## 2015-08-22 VITALS — BP 108/69 | HR 71 | Ht 68.0 in | Wt 209.0 lb

## 2015-08-22 DIAGNOSIS — G43829 Menstrual migraine, not intractable, without status migrainosus: Secondary | ICD-10-CM | POA: Diagnosis not present

## 2015-08-22 DIAGNOSIS — N943 Premenstrual tension syndrome: Secondary | ICD-10-CM

## 2015-08-22 DIAGNOSIS — G43109 Migraine with aura, not intractable, without status migrainosus: Secondary | ICD-10-CM | POA: Diagnosis not present

## 2015-08-22 MED ORDER — RIZATRIPTAN BENZOATE 10 MG PO TBDP: 10.0000 mg | ORAL_TABLET | ORAL | Status: DC | PRN

## 2015-08-22 MED ORDER — TOPIRAMATE 50 MG PO TABS: 50.0000 mg | ORAL_TABLET | Freq: Two times a day (BID) | ORAL | Status: DC

## 2015-08-22 NOTE — Progress Notes (Signed)
[facility]  PATIENT: Melissa [name] DOB: Oct 01, 1980  REFERRING CLINICIAN: Karna DupesF Wong, MD HISTORY FROM: patient REASON FOR VISIT: follow up    HISTORICAL  CHIEF COMPLAINT:  Chief Complaint  Patient presents with  . Migraine    rm 7, "only getting HA right before my periods. Tension in my temples today. Ibuprofen 600 mg helps, Rizatriptan makes me too drowsy x 2 days"  . Follow-up    6 month    HISTORY OF PRESENT ILLNESS:   UPDATE 08/22/15: Now having 1-2 migraine per month usually right before menstrual cycle. Ibuprofen helps. Rizatriptan helps but causes drowsiness. Now on TPX 50mg  at bedtime. Overall doing well and better.   UPDATE 02/14/15: Since last visit, severe migraine are 2 per month; usually pre-menstrual 2 days. TPX 50mg  qhs, tolerable. Rizatriptan helps with HA, but causes drowsiness afterwards for 1-2 days.   UPDATE 10/04/14: Since last visit, was doing a little better with improved work schedule. Still on TPX 50mg  qhs and rizatriptan prn. Rizatriptan tried 2 times, which helps relieve HA, but causes sedation and fogginess. Then went to ER on July 12 for severe HA with sinus pressure. Now on sinus meds and abx. Avg 1 severe migraine per week.   UPDATE 06/27/14: Since last visit, still with daily HA, early AM. No more visual aura since getting new glasses.   PRIOR HPI (05/15/14): 35 year old right-handed female here for consultation at request of Dr. Modesto CharonWong, for evaluation of new onset visual disturbance, headache, neck numbness. January 2016 patient started a new work schedule, waking up at 3 in the morning, starting work at 4 AM. Patient has 2 small children at home and she typically goes to bed late in the evening. By February 2016, she had one episode of right eye blurred vision, seeing sparkling flashing shapes and lites. Symptoms lasted for 30 minutes and then resolve. Last surgery patient had another episode, similar with right eye blurred vision, seeing  rainbow colors, shapes, triangles, none left eye pressure, then frontal headache with nausea and photophobia and phonophobia. Symptoms last for a few hours and then resolved. Patient has been to PCP and ophthalmology who found no specific cause. Consideration of migraine has been made and patient referred to me for further evaluation. Overall patient has had some weight gain over the past 5 years, ranging from 178 up to 228 pounds currently. Generally she has decreased energy. No prior history of migraine. No family history of migraine. No other specific triggering her) factors.   REVIEW OF SYSTEMS: Full 14 system review of systems performed and negative except: dizziness shortness of breath chills light sens enc allergies palpitations --> asymptomatic.     ALLERGIES: Allergies  Allergen Reactions  . Clindamycin/Lincomycin     Hives and reddness over her body    HOME MEDICATIONS: Outpatient Prescriptions Prior to Visit  Medication Sig Dispense Refill  . fluticasone (FLONASE) 50 MCG/ACT nasal spray USE 1 SPRAY IN EACH NOSTRIL ONCE A DAY NASALLY  2  . ibuprofen (ADVIL,MOTRIN) 600 MG tablet Take 1 tablet (600 mg total) by mouth every 6 (six) hours as needed. 30 tablet 0  . rizatriptan (MAXALT-MLT) 10 MG disintegrating tablet Take 1 tablet (10 mg total) by mouth as needed for migraine. May repeat in 2 hours if needed 9 tablet 6  . topiramate (TOPAMAX) 50 MG tablet Take 1 tablet (50 mg total) by mouth 2 (two) times daily. 60 tablet 12   No facility-administered medications prior to visit.  PAST MEDICAL HISTORY: Past Medical History  Diagnosis Date  . Healthy adult   . Migraine     PAST SURGICAL HISTORY: Past Surgical History  Procedure Laterality Date  . No past surgeries      FAMILY HISTORY: Family History  Problem Relation Age of Onset  . Cancer Mother     kidney  . Thyroid disease Father   . Ulcers Father   . Cancer Father     thyroid  . Hypertension Brother   . Ulcers  Brother   . Hypertension Maternal Grandmother     SOCIAL HISTORY:  Social History   Social History  . Marital Status: Married    Spouse Name: Velid  . Number of Children: 2  . Years of Education: Bachelors    Occupational History  . Not on file.   Social History Main Topics  . Smoking status: Never Smoker   . Smokeless tobacco: Never Used  . Alcohol Use: No  . Drug Use: No  . Sexual Activity: Yes    Birth Control/ Protection: IUD   Other Topics Concern  . Not on file   Social History Narrative   Lives with husband, mom and two kids (boy and girl)   Drinks about 4 cups of coffee a day     PHYSICAL EXAM  Filed Vitals:   08/22/15 1246  BP: 108/69  Pulse: 71  Height:  (1.727 m)  Weight: 209 lb (94.802 kg)   Wt Readings from Last 3 Encounters:  08/22/15 209 lb (94.802 kg)  02/14/15 220 lb 9.6 oz (100.064 kg)  10/04/14 215 lb 3.2 oz (97.614 kg)    Body mass index is 31.79 kg/(m^2).  No exam data present  No flowsheet data found.  GENERAL EXAM: Patient is in no distress; well developed, nourished and groomed; neck is supple  CARDIOVASCULAR: Regular rate and rhythm, no murmurs, no carotid bruits  NEUROLOGIC: MENTAL STATUS: awake, alert, language fluent, comprehension intact, naming intact, fund of knowledge appropriate CRANIAL NERVE: pupils equal and reactive to light, visual fields full to confrontation, extraocular muscles intact, no nystagmus, facial sensation and strength symmetric, hearing intact, palate elevates symmetrically, uvula midline, shoulder shrug symmetric, tongue midline. MOTOR: normal bulk and tone, full strength in the BUE, BLE SENSORY: normal and symmetric to light touch  COORDINATION: finger-nose-finger normal REFLEXES: deep tendon reflexes present and symmetric GAIT/STATION: narrow based gait; romberg is negative    DIAGNOSTIC DATA (LABS, IMAGING, TESTING) - I reviewed patient records, labs, notes, testing and imaging myself  where available.  Lab Results  Component Value Date   WBC 10.8* 06/19/2013   HGB 11.0* 06/19/2013   HCT 33.2* 06/19/2013   MCV 90.2 06/19/2013   PLT 189 06/19/2013   No results found for: NA, K, CL, CO2, GLUCOSE, BUN, CREATININE, CALCIUM, PROT, ALBUMIN, AST, ALT, ALKPHOS, BILITOT, GFRNONAA, GFRAA No results found for: CHOL, HDL, LDLCALC, LDLDIRECT, TRIG, CHOLHDL No results found for: JYNW2N No results found for: VITAMINB12 No results found for: TSH   05/18/14 MRI BRAIN - normal     ASSESSMENT AND PLAN  35 y.o. year old female here with new onset vision changes and new onset headache, likely migraine with aura. Neuro exam and MRI brain unremarkable. Headaches stable.   Dx:   Migraine with aura and without status migrainosus, not intractable  Menstrual migraine without status migrainosus, not intractable     PLAN: I spent 15 minutes of face to face time with patient. Greater than 50% of time  was spent in counseling and coordination of care with patient. In summary we discussed:  - continue topiramate 50mg  at bedtime; may taper off as a trial since patient doing much better and she is requesting medication holiday - may continue ibuprofen and rizatriptan as needed - follow up with PCP or cardiology re: palpitations (asymptomatic)  Meds ordered this encounter  Medications  . topiramate (TOPAMAX) 50 MG tablet    Sig: Take 1 tablet (50 mg total) by mouth 2 (two) times daily.    Dispense:  60 tablet    Refill:  12  . rizatriptan (MAXALT-MLT) 10 MG disintegrating tablet    Sig: Take 1 tablet (10 mg total) by mouth as needed for migraine. May repeat in 2 hours if needed    Dispense:  9 tablet    Refill:  6   Return in about 6 months (around 02/21/2016).    Suanne Marker, MD 08/22/2015, 1:17 PM Certified in Neurology, Neurophysiology and Neuroimaging  [facility] [address] [place], [place] [address] [phone]

## 2015-08-22 NOTE — Patient Instructions (Signed)
-   may taper off topiramate (half tab x 1 week, then stop)  - continue ibuprofen and rizatriptan as needed

## 2015-08-24 ENCOUNTER — Telehealth: Payer: Self-pay | Admitting: *Deleted

## 2015-08-24 NOTE — Telephone Encounter (Signed)
I faxed pt FLMA form to [employer] on 08/24/2015.

## 2015-12-07 ENCOUNTER — Emergency Department (HOSPITAL_BASED_OUTPATIENT_CLINIC_OR_DEPARTMENT_OTHER): Payer: BLUE CROSS/BLUE SHIELD

## 2015-12-07 ENCOUNTER — Encounter (HOSPITAL_BASED_OUTPATIENT_CLINIC_OR_DEPARTMENT_OTHER): Payer: Self-pay | Admitting: *Deleted

## 2015-12-07 ENCOUNTER — Emergency Department (HOSPITAL_BASED_OUTPATIENT_CLINIC_OR_DEPARTMENT_OTHER)
Admission: EM | Admit: 2015-12-07 | Discharge: 2015-12-07 | Disposition: A | Discharge status: Home / Self Care | Payer: BLUE CROSS/BLUE SHIELD | Attending: Emergency Medicine | Admitting: Emergency Medicine

## 2015-12-07 DIAGNOSIS — R6883 Chills (without fever): Secondary | ICD-10-CM | POA: Insufficient documentation

## 2015-12-07 DIAGNOSIS — R110 Nausea: Secondary | ICD-10-CM | POA: Diagnosis not present

## 2015-12-07 DIAGNOSIS — Z791 Long term (current) use of non-steroidal anti-inflammatories (NSAID): Secondary | ICD-10-CM | POA: Diagnosis not present

## 2015-12-07 DIAGNOSIS — R109 Unspecified abdominal pain: Secondary | ICD-10-CM

## 2015-12-07 DIAGNOSIS — R319 Hematuria, unspecified: Secondary | ICD-10-CM | POA: Diagnosis not present

## 2015-12-07 LAB — PREGNANCY, URINE: Preg Test, Ur: NEGATIVE

## 2015-12-07 LAB — URINALYSIS, ROUTINE W REFLEX MICROSCOPIC
Bilirubin Urine: NEGATIVE
GLUCOSE, UA: NEGATIVE mg/dL
KETONES UR: NEGATIVE mg/dL
Leukocytes, UA: NEGATIVE
Nitrite: NEGATIVE
Protein, ur: NEGATIVE mg/dL
Specific Gravity, Urine: 1.013 (ref 1.005–1.030)
pH: 6.5 (ref 5.0–8.0)

## 2015-12-07 LAB — URINE MICROSCOPIC-ADD ON: WBC, UA: NONE SEEN WBC/hpf (ref 0–5)

## 2015-12-07 MED ORDER — ONDANSETRON HCL 4 MG/2ML IJ SOLN
4.0000 mg | Freq: Once | INTRAMUSCULAR | Status: AC
  Administered 2015-12-07: 4 mg via INTRAVENOUS
  Filled 2015-12-07: qty 2

## 2015-12-07 MED ORDER — TRAMADOL HCL 50 MG PO TABS: 50.0000 mg | ORAL_TABLET | Freq: Four times a day (QID) | ORAL | 0 refills | Status: AC | PRN

## 2015-12-07 MED ORDER — KETOROLAC TROMETHAMINE 30 MG/ML IJ SOLN
30.0000 mg | Freq: Once | INTRAMUSCULAR | Status: AC
  Administered 2015-12-07: 30 mg via INTRAVENOUS
  Filled 2015-12-07: qty 1

## 2015-12-07 MED ORDER — MORPHINE SULFATE (PF) 4 MG/ML IV SOLN
4.0000 mg | Freq: Once | INTRAVENOUS | Status: AC
  Administered 2015-12-07: 4 mg via INTRAVENOUS
  Filled 2015-12-07: qty 1

## 2015-12-07 MED ORDER — MORPHINE SULFATE (PF) 2 MG/ML IV SOLN
2.0000 mg | Freq: Once | INTRAVENOUS | Status: AC
  Administered 2015-12-07: 2 mg via INTRAVENOUS
  Filled 2015-12-07: qty 1

## 2015-12-07 NOTE — ED Provider Notes (Signed)
[facility] Provider Note   CSN: [id number] Arrival date & time: 12/07/15  1826  By signing my name below, I, [name], attest that this documentation has been prepared under the direction and in the presence of Fernande Treiber, PA-C. Electronically Signed: Angelene Giovanni, ED Scribe. 12/07/15. 8:13 PM.    History   Chief Complaint Chief Complaint  Patient presents with  . Abdominal Pain    HPI Comments: Melissa [name] is a 35 y.o. female who presents to the Emergency Department complaining of gradually worsening 7/10 right flank pain that radiates to her RLQ onset last night, worsening this morning. She reports associated nausea, chills, and urinary frequency. She notes that the pain is worse with movement as she cannot get any comfort with any position. No alleviating factors noted. Pt has not tried any medications PTA. She adds that her last meal was at 10:30 am and has increased her fluid intake. She was seen at [facility] and she states that at the [facility], the provider was worried aboutkidney stones or appendicitis and was advised to follow up with the ED for further evaluation. She had blood work there which revealed hematuria, otherwise CBC and metabolic panel unremarkable. Pt is currently on her menstrual cycle. She reports that she recently finished treatment for a yeast infection. Pt has an allergy to clindamycin. She denies any fever or vomiting. Denies any kind of urinary symptoms.  The history is provided by the patient. No language interpreter was used.    Past Medical History:  Diagnosis Date  . Healthy adult   . Migraine     Patient Active Problem List   Diagnosis Date Noted  . Menstrual migraine without status migrainosus, not intractable 08/22/2015  . Migraine with aura and without status migrainosus, not intractable 10/04/2014  . Normal labor and delivery 06/18/2013    Past Surgical History:  Procedure Laterality Date  . NO PAST SURGERIES      OB  History    Gravida Para Term Preterm AB Living   2 2 2     2    SAB TAB Ectopic Multiple Live Births           2       Home Medications    Prior to Admission medications   Medication Sig Start Date End Date Taking? Authorizing Provider  ibuprofen (ADVIL,MOTRIN) 600 MG tablet Take 1 tablet (600 mg total) by mouth every 6 (six) hours as needed. 09/12/14  Yes Benjamin Cartner, PA-C  fluticasone (FLONASE) 50 MCG/ACT nasal spray USE 1 SPRAY IN EACH NOSTRIL ONCE A DAY NASALLY 09/19/14   Historical Provider, MD  rizatriptan (MAXALT-MLT) 10 MG disintegrating tablet Take 1 tablet (10 mg total) by mouth as needed for migraine. May repeat in 2 hours if needed 08/22/15   Suanne Marker, MD  topiramate (TOPAMAX) 50 MG tablet Take 1 tablet (50 mg total) by mouth 2 (two) times daily. 08/22/15   Suanne Marker, MD    Family History Family History  Problem Relation Age of Onset  . Cancer Mother     kidney  . Thyroid disease Father   . Ulcers Father   . Cancer Father     thyroid  . Hypertension Brother   . Ulcers Brother   . Hypertension Maternal Grandmother     Social History Social History  Substance Use Topics  . Smoking status: Never Smoker  . Smokeless tobacco: Never Used  . Alcohol use No     Allergies  Clindamycin/lincomycin   Review of Systems Review of Systems  Constitutional: Positive for chills. Negative for fever.  Gastrointestinal: Positive for abdominal pain and nausea. Negative for vomiting.  Genitourinary: Positive for flank pain, frequency and hematuria.  All other systems reviewed and are negative.    Physical Exam Updated Vital Signs BP 117/71   Pulse 66   Temp 97.5 F (36.4 C) (Oral)   Resp 18   Ht 5\' 8"  (1.727 m)   Wt 213 lb (96.6 kg)   LMP 12/07/2015   SpO2 99%   BMI 32.39 kg/m   Physical Exam  Constitutional: She is oriented to person, place, and time.  HENT:  Right Ear: External ear normal.  Left Ear: External ear normal.  Nose: Nose  normal.  Mouth/Throat: Oropharynx is clear and moist. No oropharyngeal exudate.  Eyes: Conjunctivae are normal.  Neck: Neck supple.  Cardiovascular: Normal rate, regular rhythm, normal heart sounds and intact distal pulses.   Pulmonary/Chest: Effort normal and breath sounds normal. No respiratory distress. She has no wheezes.  Abdominal: Soft. Bowel sounds are normal.  +R CVA tenderness No RLQ or other abdominal tenderness No rebound or guarding  Musculoskeletal: She exhibits no edema.  Lymphadenopathy:    She has no cervical adenopathy.  Neurological: She is alert and oriented to person, place, and time. No cranial nerve deficit.  Skin: Skin is warm and dry.  Psychiatric: She has a normal mood and affect.  Nursing note and vitals reviewed.    ED Treatments / Results  DIAGNOSTIC STUDIES: Oxygen Saturation is 99% on RA, normal by my interpretation.    COORDINATION OF CARE: 8:10 PM- Pt advised of plan for treatment and pt agrees. Pt will receive CT renal stone study for further evaluation.    Labs (all labs ordered are listed, but only abnormal results are displayed) Labs Reviewed  URINALYSIS, ROUTINE W REFLEX MICROSCOPIC (NOT AT [facility]) - Abnormal; Notable for the following:       Result Value   Hgb urine dipstick SMALL (*)    All other components within normal limits  URINE MICROSCOPIC-ADD ON - Abnormal; Notable for the following:    Squamous Epithelial / LPF 0-5 (*)    Bacteria, UA RARE (*)    All other components within normal limits  PREGNANCY, URINE    EKG  EKG Interpretation None       Radiology US Transvaginal Non-ob  Result Date: 12/07/2015 CLINICAL DATA:  Chronic intermittent right lower back and right lower quadrant abdominal pain. Initial encounter. EXAM: TRANSABDOMINAL AND TRANSVAGINAL ULTRASOUND OF PELVIS TECHNIQUE: Both transabdominal and transvaginal ultrasound examinations of the pelvis were performed. Transabdominal technique was performed for global  imaging of the pelvis including uterus, ovaries, adnexal regions, and pelvic cul-de-sac. It was necessary to proceed with endovaginal exam following the transabdominal exam to visualize the uterus and ovaries in greater detail. COMPARISON:  CT of the abdomen and pelvis performed earlier today at 8:57 p.m. FINDINGS: Uterus Measurements: 8.2 x 4.4 x 5.1 cm. No fibroids or other mass visualized. Retroverted in nature. Endometrium Thickness: 1.0 cm. No focal abnormality visualized. The patient's intrauterine device is noted in expected position at the fundus of the uterus. Right ovary Measurements: 4.1 x 1.7 x 2.3 cm. Normal appearance/no adnexal mass. Left ovary Measurements: 3.9 x 1.6 x 2.5 cm. Normal appearance/no adnexal mass. Limited Doppler evaluation demonstrates normal color Doppler blood flow with regard to both ovaries. There is no evidence for ovarian torsion. Other findings A small amount of  free fluid is seen within the pelvis. IMPRESSION: Unremarkable pelvic ultrasound. Intrauterine device noted in expected position at the fundus of the uterus. Electronically Signed   By: Roanna Raider M.D.   On: 12/07/2015 22:57   US Pelvis Complete  Result Date: 12/07/2015 CLINICAL DATA:  Chronic intermittent right lower back and right lower quadrant abdominal pain. Initial encounter. EXAM: TRANSABDOMINAL AND TRANSVAGINAL ULTRASOUND OF PELVIS TECHNIQUE: Both transabdominal and transvaginal ultrasound examinations of the pelvis were performed. Transabdominal technique was performed for global imaging of the pelvis including uterus, ovaries, adnexal regions, and pelvic cul-de-sac. It was necessary to proceed with endovaginal exam following the transabdominal exam to visualize the uterus and ovaries in greater detail. COMPARISON:  CT of the abdomen and pelvis performed earlier today at 8:57 p.m. FINDINGS: Uterus Measurements: 8.2 x 4.4 x 5.1 cm. No fibroids or other mass visualized. Retroverted in nature. Endometrium  Thickness: 1.0 cm. No focal abnormality visualized. The patient's intrauterine device is noted in expected position at the fundus of the uterus. Right ovary Measurements: 4.1 x 1.7 x 2.3 cm. Normal appearance/no adnexal mass. Left ovary Measurements: 3.9 x 1.6 x 2.5 cm. Normal appearance/no adnexal mass. Limited Doppler evaluation demonstrates normal color Doppler blood flow with regard to both ovaries. There is no evidence for ovarian torsion. Other findings A small amount of free fluid is seen within the pelvis. IMPRESSION: Unremarkable pelvic ultrasound. Intrauterine device noted in expected position at the fundus of the uterus. Electronically Signed   By: Roanna Raider M.D.   On: 12/07/2015 22:57   Ct Renal Stone Study  Result Date: 12/07/2015 CLINICAL DATA:  Right flank pain last night.  Hematuria. EXAM: CT ABDOMEN AND PELVIS WITHOUT CONTRAST TECHNIQUE: Multidetector CT imaging of the abdomen and pelvis was performed following the standard protocol without IV contrast. COMPARISON:  None. FINDINGS: Lower chest: Lung bases are clear. Hepatobiliary: Unenhanced appearance is unremarkable. Pancreas: Unenhanced appearance is unremarkable. Spleen: Unenhanced appearance is unremarkable. Adrenals/Urinary Tract: No adrenal gland nodules. Kidneys are symmetrical in size and shape. No hydronephrosis or hydroureter. No renal, ureteral, or bladder stones. Bladder is decompressed. Stomach/Bowel: Stomach, small bowel, and colon are not abnormally distended. Gas and stool throughout the colon. No bowel wall thickening is appreciated. Appendix is normal. Vascular/Lymphatic: No significant vascular findings are present. No enlarged abdominal or pelvic lymph nodes. Reproductive: An intrauterine device is present. Uterus and ovaries are not enlarged. Small amount of free fluid in the pelvis is likely to be physiologic. Other: No abdominal wall hernia or abnormality. No abdominopelvic ascites. Musculoskeletal: No acute or  significant osseous findings. IMPRESSION: No renal or ureteral stone or obstruction. No acute process demonstrated in the abdomen or pelvis on noncontrast imaging. Small amount of free fluid in the pelvis is likely physiologic. Intrauterine device is present. Electronically Signed   By: Burman Nieves M.D.   On: 12/07/2015 21:25    Procedures Procedures (including critical care time)  Medications Ordered in ED Medications  ketorolac (TORADOL) 30 MG/ML injection 30 mg (30 mg Intravenous Given 12/07/15 2030)  ondansetron (ZOFRAN) injection 4 mg (4 mg Intravenous Given 12/07/15 2031)  morphine 2 MG/ML injection 2 mg (2 mg Intravenous Given 12/07/15 2030)  morphine 4 MG/ML injection 4 mg (4 mg Intravenous Given 12/07/15 2231)     Initial Impression / Assessment and Plan / ED Course  Noelle Penner, PA-C has reviewed the triage vital signs and the nursing notes.  Pertinent labs & imaging results that were available during my care of  the patient were reviewed by me and considered in my medical decision making (see chart for details).  Clinical Course   Did not repeat bloodwork here as [facility] unremark[facility]. Urine preg negative. UA unremarkable. Pt is menstruating. Her pain is improved in the ED. Ordered CT renal study as pt's exam really only significant for right CVA tenderness and no abdominal tenderness, no n/v, labs otherwise normal. CT renal study with no stones, appendix normal, no other acute findings other than small amount of pelvic free fluid. Pt did report history of right ovarian cyst but states this pain feels different. Given negative CT I did order ultrasound which was also negative other than a small amount of pelvic free fluid. Pain is resolved and repeat abdominal exam benign. Will give short course of tramadol as needed. Pt has PCP and OB/GYN follow up scheduled. ER return precautions given.   Final Clinical Impressions(s) / ED Diagnoses   Final diagnoses:  Right flank  pain    New Prescriptions New Prescriptions   TRAMADOL (ULTRAM) 50 MG TABLET    Take 1 tablet (50 mg total) by mouth every 6 (six) hours as needed.   I personally performed the services described in this documentation, which was scribed in my presence. The recorded information has been reviewed and is accurate.    Carlene CoriaSerena Y Itzel Lowrimore, PA-C 12/07/15 2329    Doug SouSam Jacubowitz, MD 12/07/15 [phone]

## 2015-12-07 NOTE — Discharge Instructions (Signed)
Your CT scan and ultrasounds were unremarkable with no evidence of kidney stone, appendicitis, ovarian problem, or other intra-abdominal issue. Your repeat urine test was again unremarkable. We did not repeat bloodwork her since the [facility] was un[facility]. Please follow up with your primary care provider and OBGYN as scheduled. I gave you a prescription for Tramadol to take as needed for pain. If your pain is not that severe you may take the ibuprofen you have at home. Return to the ER for new or worsening symptoms.

## 2015-12-07 NOTE — ED Notes (Signed)
Gave pt crakers and ginger ale due to nausea

## 2015-12-07 NOTE — ED Triage Notes (Signed)
Lower abdominal pain since last night. [facility] and was sent here after evaluation for further evaluation.

## 2016-02-01 DIAGNOSIS — L409 Psoriasis, unspecified: Secondary | ICD-10-CM

## 2016-02-01 HISTORY — DX: Psoriasis, unspecified: L40.9

## 2016-02-19 ENCOUNTER — Encounter: Payer: Self-pay | Admitting: Diagnostic Neuroimaging

## 2016-02-19 ENCOUNTER — Ambulatory Visit (INDEPENDENT_AMBULATORY_CARE_PROVIDER_SITE_OTHER): Payer: BLUE CROSS/BLUE SHIELD | Admitting: Diagnostic Neuroimaging

## 2016-02-19 VITALS — BP 116/69 | HR 69 | Wt 218.8 lb

## 2016-02-19 DIAGNOSIS — R635 Abnormal weight gain: Secondary | ICD-10-CM | POA: Diagnosis not present

## 2016-02-19 DIAGNOSIS — L409 Psoriasis, unspecified: Secondary | ICD-10-CM

## 2016-02-19 DIAGNOSIS — G4726 Circadian rhythm sleep disorder, shift work type: Secondary | ICD-10-CM | POA: Diagnosis not present

## 2016-02-19 DIAGNOSIS — G43109 Migraine with aura, not intractable, without status migrainosus: Secondary | ICD-10-CM | POA: Diagnosis not present

## 2016-02-19 MED ORDER — TOPIRAMATE 50 MG PO TABS: 50.0000 mg | ORAL_TABLET | Freq: Two times a day (BID) | ORAL | 12 refills | Status: AC

## 2016-02-19 MED ORDER — RIZATRIPTAN BENZOATE 10 MG PO TBDP: 10.0000 mg | ORAL_TABLET | ORAL | 6 refills | Status: AC | PRN

## 2016-02-19 NOTE — Progress Notes (Signed)
[facility]  PATIENT: Melissa [name] DOB: 02-Oct-1980  REFERRING CLINICIAN: Karna Dupes, MD HISTORY FROM: patient REASON FOR VISIT: follow up   HISTORICAL  CHIEF COMPLAINT:  Chief Complaint  Patient presents with  . Migraine    rm 7, " migraines got better, until 3 wks ago, started diff shift at work- up at 3 am; trying to go to bed earlier; not active due to work shift"  . Follow-up    6 month    HISTORY OF PRESENT ILLNESS:   UPDATE 02/19/16: Since last visit, doing well. Tried acupuncture in summer 2017 (which helped headaches and left leg sciatica). Then off TPX since then, and doing well until 2 weeks ago, now back on early shift work and HA slightly returning. Planning to be on early shift for next 3 months, then back to normal shift. Palpitations spontaneously resolved.   UPDATE 08/22/15: Now having 1-2 migraine per month usually right before menstrual cycle. Ibuprofen helps. Rizatriptan helps but causes drowsiness. Now on TPX 50mg  at bedtime. Overall doing well and better.   UPDATE 02/14/15: Since last visit, severe migraine are 2 per month; usually pre-menstrual 2 days. TPX 50mg  qhs, tolerable. Rizatriptan helps with HA, but causes drowsiness afterwards for 1-2 days.   UPDATE 10/04/14: Since last visit, was doing a little better with improved work schedule. Still on TPX 50mg  qhs and rizatriptan prn. Rizatriptan tried 2 times, which helps relieve HA, but causes sedation and fogginess. Then went to ER on July 12 for severe HA with sinus pressure. Now on sinus meds and abx. Avg 1 severe migraine per week.   UPDATE 06/27/14: Since last visit, still with daily HA, early AM. No more visual aura since getting new glasses.   PRIOR HPI (05/15/14): 35 year old right-handed female here for consultation at request of Dr. Modesto Charon, for evaluation of new onset visual disturbance, headache, neck numbness. January 2016 patient started a new work schedule, waking up at 3 in the  morning, starting work at 4 AM. Patient has 2 small children at home and she typically goes to bed late in the evening. By February 2016, she had one episode of right eye blurred vision, seeing sparkling flashing shapes and lites. Symptoms lasted for 30 minutes and then resolve. Last surgery patient had another episode, similar with right eye blurred vision, seeing rainbow colors, shapes, triangles, none left eye pressure, then frontal headache with nausea and photophobia and phonophobia. Symptoms last for a few hours and then resolved. Patient has been to PCP and ophthalmology who found no specific cause. Consideration of migraine has been made and patient referred to me for further evaluation. Overall patient has had some weight gain over the past 5 years, ranging from 178 up to 228 pounds currently. Generally she has decreased energy. No prior history of migraine. No family history of migraine. No other specific triggering her) factors.   REVIEW OF SYSTEMS: Full 14 system review of systems performed and negative except: dizziness shortness of breath chills light sens enc allergies palpitations --> asymptomatic.    ALLERGIES: Allergies  Allergen Reactions  . Clindamycin/Lincomycin     Hives and reddness over her body    HOME MEDICATIONS: Outpatient Medications Prior to Visit  Medication Sig Dispense Refill  . fluticasone (FLONASE) 50 MCG/ACT nasal spray USE 1 SPRAY IN EACH NOSTRIL ONCE A DAY NASALLY  2  . ibuprofen (ADVIL,MOTRIN) 600 MG tablet Take 1 tablet (600 mg total) by mouth every 6 (six) hours as needed. 30 tablet  0  . rizatriptan (MAXALT-MLT) 10 MG disintegrating tablet Take 1 tablet (10 mg total) by mouth as needed for migraine. May repeat in 2 hours if needed 9 tablet 6  . topiramate (TOPAMAX) 50 MG tablet Take 1 tablet (50 mg total) by mouth 2 (two) times daily. 60 tablet 12  . traMADol (ULTRAM) 50 MG tablet Take 1 tablet (50 mg total) by mouth every 6 (six) hours as needed. 10  tablet 0   No facility-administered medications prior to visit.     PAST MEDICAL HISTORY: Past Medical History:  Diagnosis Date  . Healthy adult   . Migraine   . Psoriasis 02/2016    PAST SURGICAL HISTORY: Past Surgical History:  Procedure Laterality Date  . NO PAST SURGERIES      FAMILY HISTORY: Family History  Problem Relation Age of Onset  . Cancer Mother     kidney  . Thyroid disease Father   . Ulcers Father   . Cancer Father     thyroid  . Hypertension Brother   . Ulcers Brother   . Hypertension Maternal Grandmother     SOCIAL HISTORY:  Social History   Social History  . Marital status: Married    Spouse name: Velid  . Number of children: 2  . Years of education: Bachelors    Occupational History  . Not on file.   Social History Main Topics  . Smoking status: Never Smoker  . Smokeless tobacco: Never Used  . Alcohol use No  . Drug use: No  . Sexual activity: Yes    Birth control/ protection: IUD   Other Topics Concern  . Not on file   Social History Narrative   Lives with husband, mom and two kids (boy and girl)   Drinks about 4 cups of coffee a day     PHYSICAL EXAM  Vitals:   02/19/16 0912  BP: 116/69  Pulse: 69  Weight: 218 lb 12.8 oz (99.2 kg)   Wt Readings from Last 3 Encounters:  02/19/16 218 lb 12.8 oz (99.2 kg)  12/07/15 213 lb (96.6 kg)  08/22/15 209 lb (94.8 kg)    Body mass index is 33.27 kg/m.  No exam data present  No flowsheet data found.  GENERAL EXAM: Patient is in no distress; well developed, nourished and groomed; neck is supple  CARDIOVASCULAR: Regular rate and rhythm, no murmurs, no carotid bruits  NEUROLOGIC: MENTAL STATUS: awake, alert, language fluent, comprehension intact, naming intact, fund of knowledge appropriate CRANIAL NERVE: pupils equal and reactive to light, visual fields full to confrontation, extraocular muscles intact, no nystagmus, facial sensation and strength symmetric, hearing  intact, palate elevates symmetrically, uvula midline, shoulder shrug symmetric, tongue midline. MOTOR: normal bulk and tone, full strength in the BUE, BLE SENSORY: normal and symmetric to light touch  COORDINATION: finger-nose-finger normal REFLEXES: deep tendon reflexes present and symmetric GAIT/STATION: narrow based gait; romberg is negative    DIAGNOSTIC DATA (LABS, IMAGING, TESTING) - I reviewed patient records, labs, notes, testing and imaging myself where available.  Lab Results  Component Value Date   WBC 10.8 (H) 06/19/2013   HGB 11.0 (L) 06/19/2013   HCT 33.2 (L) 06/19/2013   MCV 90.2 06/19/2013   PLT 189 06/19/2013   No results found for: NA, K, CL, CO2, GLUCOSE, BUN, CREATININE, CALCIUM, PROT, ALBUMIN, AST, ALT, ALKPHOS, BILITOT, GFRNONAA, GFRAA No results found for: CHOL, HDL, LDLCALC, LDLDIRECT, TRIG, CHOLHDL No results found for: ONGE9BHGBA1C No results found for: VITAMINB12 No results  found for: TSH   05/18/14 MRI BRAIN - normal     ASSESSMENT AND PLAN  35 y.o. year old female here with new onset vision changes and new onset headache, likely migraine with aura. Neuro exam and MRI brain unremarkable. Headaches improved, then returning again. Also with new dx of psoriasis and has rheumatology follow up today.   Dx:   Migraine with aura and without status migrainosus, not intractable  Psoriasis  Weight gain  Shift work sleep disorder    PLAN: - restart topiramate 50mg  at bedtime - may continue ibuprofen and rizatriptan as needed  Meds ordered this encounter  Medications  . topiramate (TOPAMAX) 50 MG tablet    Sig: Take 1 tablet (50 mg total) by mouth 2 (two) times daily.    Dispense:  60 tablet    Refill:  12  . rizatriptan (MAXALT-MLT) 10 MG disintegrating tablet    Sig: Take 1 tablet (10 mg total) by mouth as needed for migraine. May repeat in 2 hours if needed    Dispense:  9 tablet    Refill:  6   Return in about 6 months (around  08/19/2016).    Suanne MarkerVIKRAM R. Oz Gammel, MD 02/19/2016, 9:46 AM Certified in Neurology, Neurophysiology and Neuroimaging  [facility] [address], [address] [place], [place] [address] [phone]

## 2016-07-10 ENCOUNTER — Encounter: Payer: Self-pay | Admitting: Diagnostic Neuroimaging

## 2016-07-15 ENCOUNTER — Encounter: Payer: Self-pay | Admitting: Dietician

## 2016-07-15 ENCOUNTER — Encounter: Payer: BLUE CROSS/BLUE SHIELD | Attending: Family Medicine | Admitting: Dietician

## 2016-07-15 DIAGNOSIS — Z713 Dietary counseling and surveillance: Secondary | ICD-10-CM | POA: Insufficient documentation

## 2016-07-15 DIAGNOSIS — E669 Obesity, unspecified: Secondary | ICD-10-CM | POA: Diagnosis not present

## 2016-07-15 DIAGNOSIS — Z6832 Body mass index (BMI) 32.0-32.9, adult: Secondary | ICD-10-CM | POA: Insufficient documentation

## 2016-07-15 NOTE — Progress Notes (Signed)
Medical Nutrition Therapy:  Appt start time: 1415 end time:  1530.   Assessment:  Primary concerns today: Patient is here alone.  She would like to lose weight and states that her body image makes her depressed at times.  She works an early morning schedule at times and often does not get adequate sleep.  Her last A1C was 5.2% 02/05/16.  She has been having knee pain and migraines.  Weight hx: 178 lbs prior to first pregnancy 7 years ago.  She felt the best there and had increased energy. 200 lbs after first child and 20 lbs after 2nd child She would like to return to 180 lbs. Weight today is down 4 lbs from usual weight.  She did mow the yard this morning.  Patient lives with her mom (who helps her with the children), husband and 26 and 11 yo children.  She works for [employer] 4 am- 12:30 am OR 9-5:30 Thursday- Monday.  She often gets only 4.5-5 hours of sleep per night.  She often eats due to exhaustion.  She is from [place] and has been hear for over 12 years.    TANITA  BODY COMP RESULTS 216 lbs 07/15/16   BMI (kg/m^2) 32.8   Fat Mass (lbs) 94.6   Fat Free Mass (lbs) 121.4   Total Body Water (lbs) 68.2    Preferred Learning Style:   No preference indicated   Learning Readiness:   Ready  Change in progress   MEDICATIONS: see list   DIETARY INTAKE:  Avoided foods include dislikes salad. Soda.   24-hr recall:  B (4 AM): chocolate and coffee with french vanilla creamer was eating yogurt with chia seeds at 6 AM but did not tolerate Snk (8 AM):  Leftovers OR [employer] cinnamon bun and coffee (white chocolate moca) L (12:30 PM): beef stew OR potatoes and chicken and homemade white bread OR hamburger and french fries Snk ( PM): none D (5 PM): chicken OR pasta OR pizza or pasta (not much and nothing specific) OR fruit OR potatoes with sour cream and vegetables  Snk ( PM): smoked beef and occasional chocolate or cake, coffee and vanilla cream Beverages: coffee with  vanilla creamer, water (>10-12 cups per day), White chocolate moca,   Usual physical activity: >10,000 steps per day when at work, sometimes cuts the lawn.  Treadmill in the garage.   Progress Towards Goal(s):  In progress.   Nutritional Diagnosis:  NB-1.1 Food and nutrition-related knowledge deficit As related to healthy eating for weight loss.  As evidenced by patient report.    Intervention:  Nutrition counseling/education regarding mindfulness with eating, healthy eating choices, and importance of adequate rest and activity. What can you do to improve your sleep and get to bed earlier? Stay active.  Aim for some form of activity most days. Eat slowly, stop when you are satisfied. Consider reading labels.  Calorie [employer] OR  Look up your favorite restaurant and the work nutrition to get the menu data. Bake, broil, or grill rather than fry.  Aim for smaller amounts of added fat. Increase your non starchy vegetable intake.  Pack your lunch the night before. Spreader not a glopper.   Teaching Method Utilized:  Visual Auditory Hands on  Handouts given during visit include:  My plate  Meal plan card  Barriers to learning/adherence to lifestyle change: time, energy, sleep  Demonstrated degree of understanding via:  Teach Back   Monitoring/Evaluation:  Dietary intake, exercise, label reading, and body weight  in 2 month(s).

## 2016-07-15 NOTE — Patient Instructions (Signed)
What can you do to improve your sleep and get to bed earlier? Stay active.  Aim for some form of activity most days. Eat slowly, stop when you are satisfied. Consider reading labels.  Calorie [employer] OR  Look up your favorite restaurant and the work nutrition to get the menu data. Bake, broil, or grill rather than fry.  Aim for smaller amounts of added fat. Increase your non starchy vegetable intake.  Pack your lunch the night before. Spreader not a glopper.

## 2016-08-19 ENCOUNTER — Ambulatory Visit: Payer: BLUE CROSS/BLUE SHIELD | Admitting: Diagnostic Neuroimaging

## 2016-09-23 ENCOUNTER — Ambulatory Visit: Payer: Self-pay | Admitting: Dietician

## 2018-01-01 ENCOUNTER — Other Ambulatory Visit: Payer: Self-pay | Admitting: Radiology

## 2018-01-01 DIAGNOSIS — N644 Mastodynia: Secondary | ICD-10-CM

## 2018-01-05 ENCOUNTER — Ambulatory Visit
Admission: RE | Admit: 2018-01-05 | Discharge: 2018-01-05 | Disposition: A | Discharge status: Home / Self Care | Payer: BLUE CROSS/BLUE SHIELD | Source: Ambulatory Visit | Attending: Radiology | Admitting: Radiology

## 2018-01-05 DIAGNOSIS — N644 Mastodynia: Secondary | ICD-10-CM

## 2018-01-13 IMAGING — CT CT RENAL STONE PROTOCOL
2 of 4 series · 16 of 46 positions shown, 18 images · non-contrast
Comparison: None.

CLINICAL DATA: Right flank pain last night.  Hematuria.

EXAM:
CT ABDOMEN AND PELVIS WITHOUT CONTRAST
TECHNIQUE: Multidetector CT imaging of the abdomen and pelvis was performed
following the standard protocol without IV contrast.

[Series 2: axial st · axial · 0.98mm/px · z∈[-538,-88]mm · 13 of 98 slices shown, 15 images]
[im 4/98  soft-tissue]
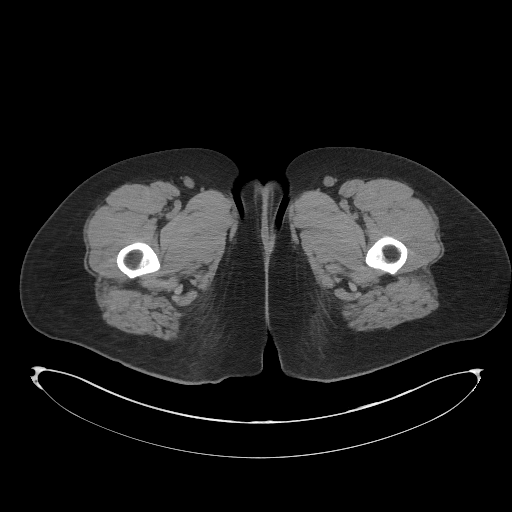
[im 4/98  bone]
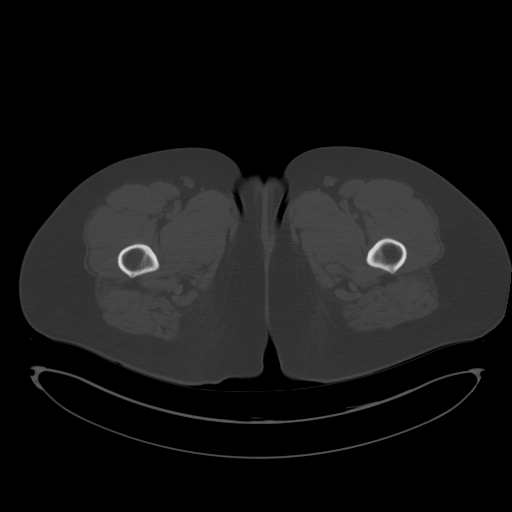
[im 12/98  soft-tissue]
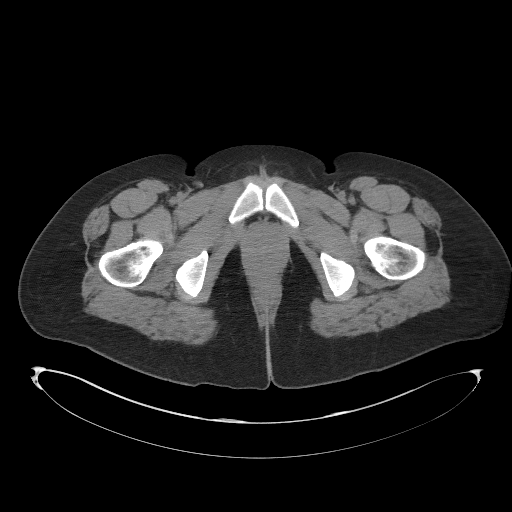
[im 20/98  soft-tissue]
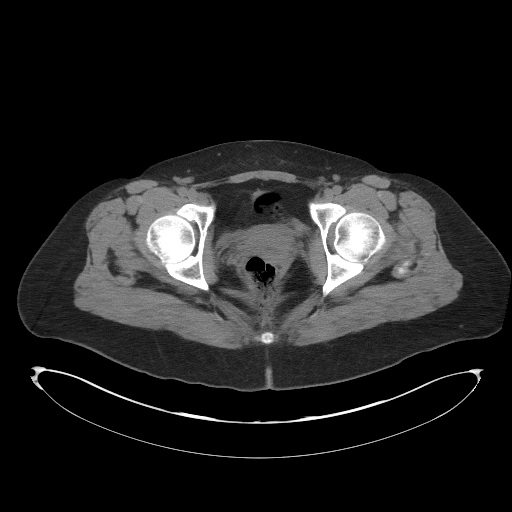
[im 28/98  soft-tissue]
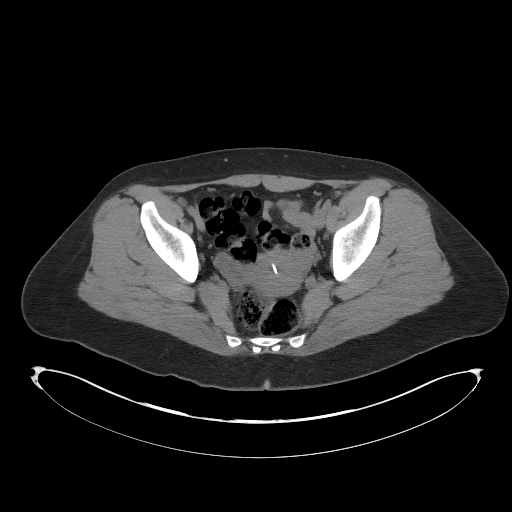
[im 35/98  soft-tissue]
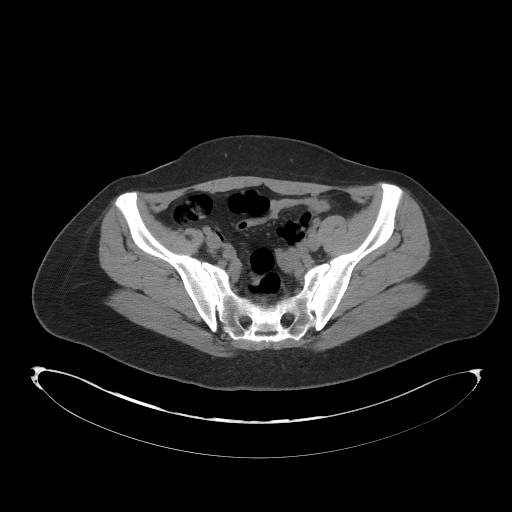
[im 43/98  soft-tissue]
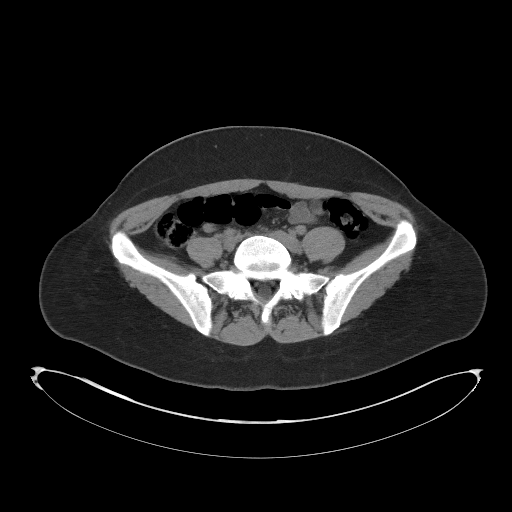
[im 51/98  soft-tissue]
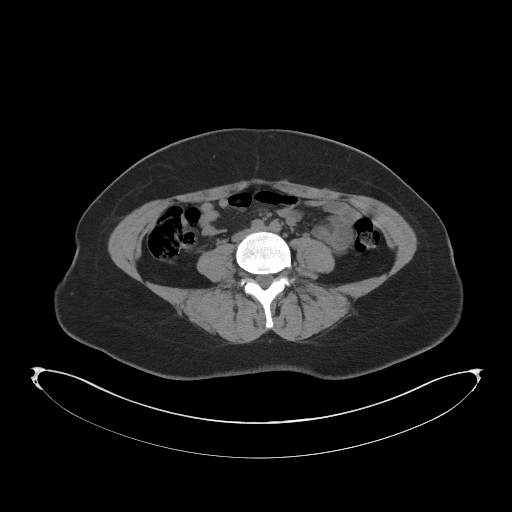
[im 55/98  soft-tissue]
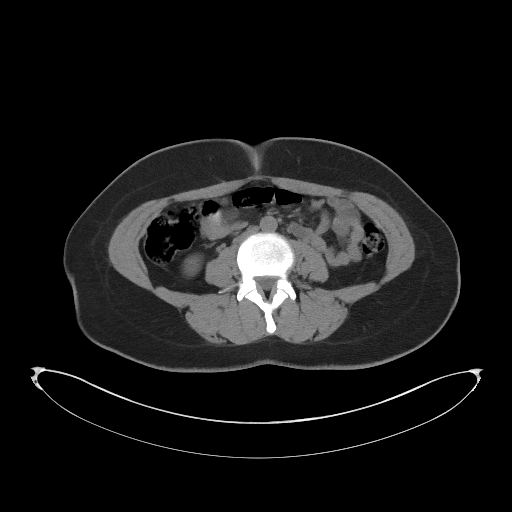
[im 63/98  soft-tissue]
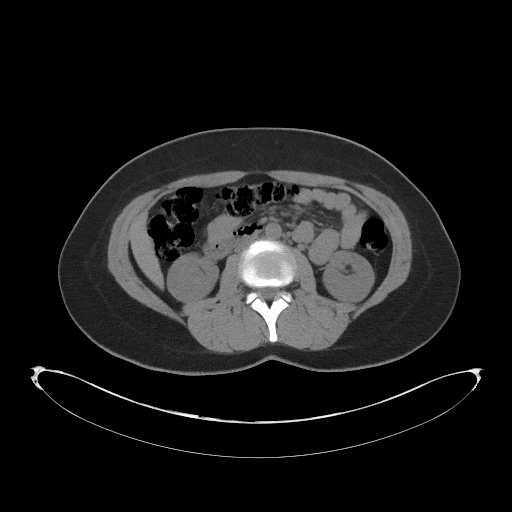
[im 63/98  bone]
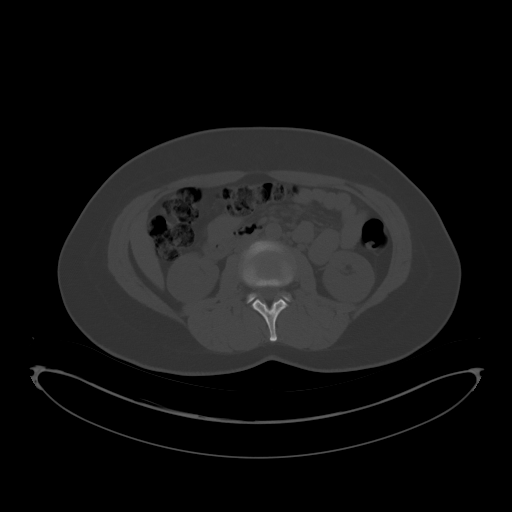
[im 70/98  soft-tissue]
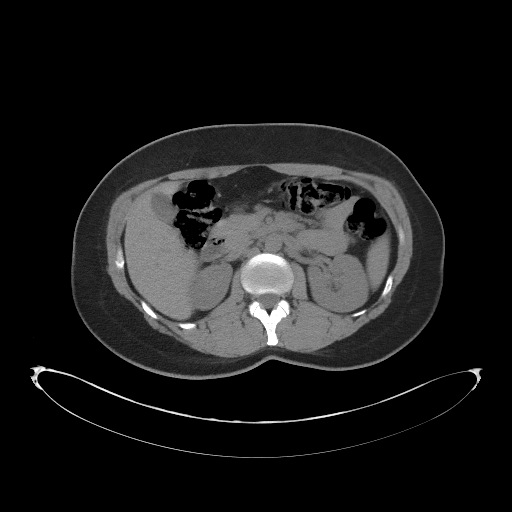
[im 78/98  soft-tissue]
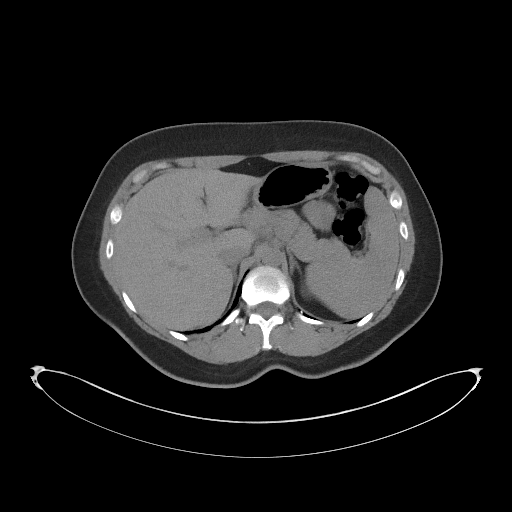
[im 86/98  soft-tissue]
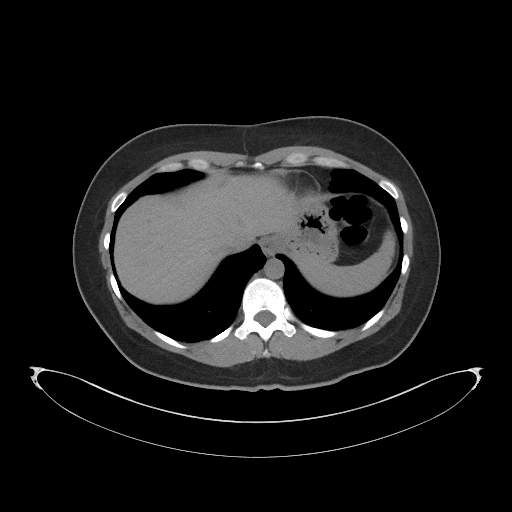
[im 94/98  soft-tissue]
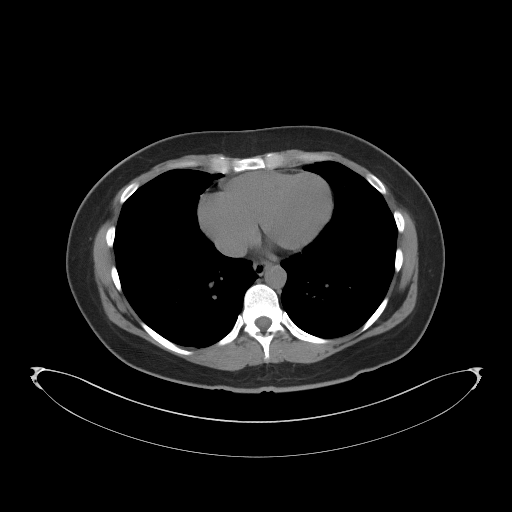

[Series 5: coronal st · coronal · 0.83mm/px · 3 of 78 slices shown]
[im 26/78  soft-tissue]
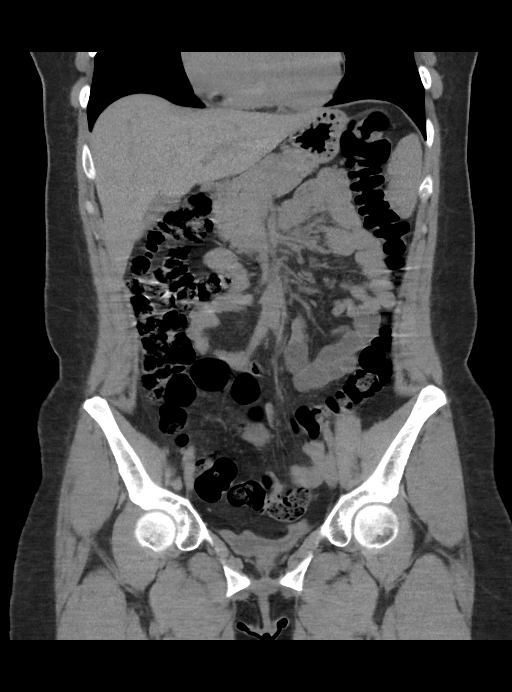
[im 35/78  soft-tissue]
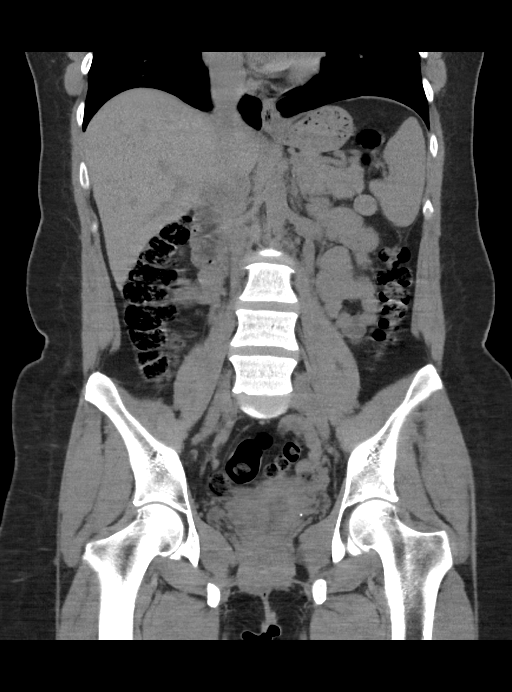
[im 43/78  soft-tissue]
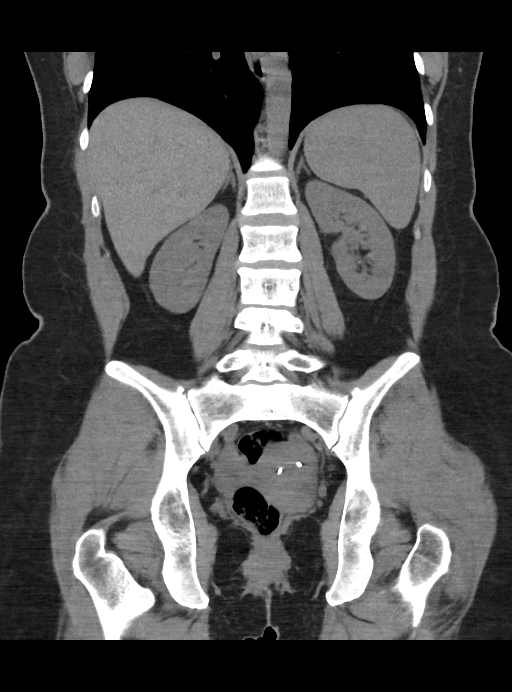

[16 of 46 positions shown; findings below may reference images not displayed]

FINDINGS: Lower chest: Lung bases are clear.

Hepatobiliary: Unenhanced appearance is unremarkable.

Pancreas: Unenhanced appearance is unremarkable.

Spleen: Unenhanced appearance is unremarkable.

Adrenals/Urinary Tract: No adrenal gland nodules. Kidneys are
symmetrical in size and shape. No hydronephrosis or hydroureter. No
renal, ureteral, or bladder stones. Bladder is decompressed.

Stomach/Bowel: Stomach, small bowel, and colon are not abnormally
distended. Gas and stool throughout the colon. No bowel wall
thickening is appreciated. Appendix is normal.

Vascular/Lymphatic: No significant vascular findings are present. No
enlarged abdominal or pelvic lymph nodes.

Reproductive: An intrauterine device is present. Uterus and ovaries
are not enlarged. Small amount of free fluid in the pelvis is likely
to be physiologic.

Other: No abdominal wall hernia or abnormality. No abdominopelvic
ascites.

Musculoskeletal: No acute or significant osseous findings.
IMPRESSION: No renal or ureteral stone or obstruction. No acute process
demonstrated in the abdomen or pelvis on noncontrast imaging. Small
amount of free fluid in the pelvis is likely physiologic.
Intrauterine device is present.

## 2019-01-21 ENCOUNTER — Emergency Department (HOSPITAL_BASED_OUTPATIENT_CLINIC_OR_DEPARTMENT_OTHER)
Admission: EM | Admit: 2019-01-21 | Discharge: 2019-01-21 | Disposition: A | Discharge status: Home / Self Care | Payer: BC Managed Care – PPO | Attending: Emergency Medicine | Admitting: Emergency Medicine

## 2019-01-21 ENCOUNTER — Other Ambulatory Visit: Payer: Self-pay

## 2019-01-21 ENCOUNTER — Encounter (HOSPITAL_BASED_OUTPATIENT_CLINIC_OR_DEPARTMENT_OTHER): Payer: Self-pay | Admitting: *Deleted

## 2019-01-21 ENCOUNTER — Emergency Department (HOSPITAL_BASED_OUTPATIENT_CLINIC_OR_DEPARTMENT_OTHER): Payer: BC Managed Care – PPO

## 2019-01-21 DIAGNOSIS — M5442 Lumbago with sciatica, left side: Secondary | ICD-10-CM | POA: Diagnosis not present

## 2019-01-21 DIAGNOSIS — R1084 Generalized abdominal pain: Secondary | ICD-10-CM | POA: Insufficient documentation

## 2019-01-21 DIAGNOSIS — Y939 Activity, unspecified: Secondary | ICD-10-CM | POA: Diagnosis not present

## 2019-01-21 DIAGNOSIS — X58XXXA Exposure to other specified factors, initial encounter: Secondary | ICD-10-CM | POA: Diagnosis not present

## 2019-01-21 DIAGNOSIS — M5441 Lumbago with sciatica, right side: Secondary | ICD-10-CM | POA: Diagnosis not present

## 2019-01-21 DIAGNOSIS — Z79899 Other long term (current) drug therapy: Secondary | ICD-10-CM | POA: Diagnosis not present

## 2019-01-21 DIAGNOSIS — M5431 Sciatica, right side: Secondary | ICD-10-CM

## 2019-01-21 DIAGNOSIS — Y999 Unspecified external cause status: Secondary | ICD-10-CM | POA: Insufficient documentation

## 2019-01-21 DIAGNOSIS — Y929 Unspecified place or not applicable: Secondary | ICD-10-CM | POA: Insufficient documentation

## 2019-01-21 DIAGNOSIS — S39012A Strain of muscle, fascia and tendon of lower back, initial encounter: Secondary | ICD-10-CM | POA: Insufficient documentation

## 2019-01-21 DIAGNOSIS — S3992XA Unspecified injury of lower back, initial encounter: Secondary | ICD-10-CM | POA: Diagnosis present

## 2019-01-21 LAB — COMPREHENSIVE METABOLIC PANEL
ALT: 13 U/L (ref 0–44)
AST: 18 U/L (ref 15–41)
Albumin: 4.2 g/dL (ref 3.5–5.0)
Alkaline Phosphatase: 39 U/L (ref 38–126)
Anion gap: 6 (ref 5–15)
BUN: 12 mg/dL (ref 6–20)
CO2: 24 mmol/L (ref 22–32)
Calcium: 8.9 mg/dL (ref 8.9–10.3)
Chloride: 106 mmol/L (ref 98–111)
Creatinine, Ser: 0.79 mg/dL (ref 0.44–1.00)
GFR calc Af Amer: >60 mL/min (ref >60)
GFR calc non Af Amer: >60 mL/min (ref >60)
Glucose, Bld: 113 mg/dL — ABNORMAL HIGH (ref 70–99)
Potassium: 3.7 mmol/L (ref 3.5–5.1)
Sodium: 136 mmol/L (ref 135–145)
Total Bilirubin: 0.4 mg/dL (ref 0.3–1.2)
Total Protein: 6.8 g/dL (ref 6.5–8.1)

## 2019-01-21 LAB — URINALYSIS, ROUTINE W REFLEX MICROSCOPIC
Bilirubin Urine: NEGATIVE
Glucose, UA: NEGATIVE mg/dL
Hgb urine dipstick: NEGATIVE
Ketones, ur: NEGATIVE mg/dL
Leukocytes,Ua: NEGATIVE
Nitrite: NEGATIVE
Protein, ur: NEGATIVE mg/dL
Specific Gravity, Urine: 1.025 (ref 1.005–1.030)
pH: 6 (ref 5.0–8.0)

## 2019-01-21 LAB — CBC WITH DIFFERENTIAL/PLATELET
Abs Immature Granulocytes: 0.02 10*3/uL (ref 0.00–0.07)
Basophils Absolute: 0 10*3/uL (ref 0.0–0.1)
Basophils Relative: 1 %
Eosinophils Absolute: 0.2 10*3/uL (ref 0.0–0.5)
Eosinophils Relative: 3 %
HCT: 37.5 % (ref 36.0–46.0)
Hemoglobin: 12.6 g/dL (ref 12.0–15.0)
Immature Granulocytes: 0 %
Lymphocytes Relative: 18 %
Lymphs Abs: 1.5 10*3/uL (ref 0.7–4.0)
MCH: 30.5 pg (ref 26.0–34.0)
MCHC: 33.6 g/dL (ref 30.0–36.0)
MCV: 90.8 fL (ref 80.0–100.0)
Monocytes Absolute: 0.7 10*3/uL (ref 0.1–1.0)
Monocytes Relative: 8 %
Neutro Abs: 5.8 10*3/uL (ref 1.7–7.7)
Neutrophils Relative %: 70 %
Platelets: 227 10*3/uL (ref 150–400)
RBC: 4.13 MIL/uL (ref 3.87–5.11)
RDW: 11.9 % (ref 11.5–15.5)
WBC: 8.2 10*3/uL (ref 4.0–10.5)
nRBC: 0 % (ref 0.0–0.2)

## 2019-01-21 LAB — PREGNANCY, URINE: Preg Test, Ur: NEGATIVE

## 2019-01-21 MED ORDER — IOHEXOL 300 MG/ML  SOLN
100.0000 mL | Freq: Once | INTRAMUSCULAR | Status: AC | PRN
  Administered 2019-01-21: 100 mL via INTRAVENOUS

## 2019-01-21 MED ORDER — KETOROLAC TROMETHAMINE 15 MG/ML IJ SOLN
15.0000 mg | Freq: Once | INTRAMUSCULAR | Status: AC
  Administered 2019-01-21: 15 mg via INTRAVENOUS
  Filled 2019-01-21: qty 1

## 2019-01-21 MED ORDER — PREDNISONE 20 MG PO TABS: 40.0000 mg | ORAL_TABLET | Freq: Every day | ORAL | 0 refills | Status: AC

## 2019-01-21 MED ORDER — METHOCARBAMOL 500 MG PO TABS: 500.0000 mg | ORAL_TABLET | Freq: Two times a day (BID) | ORAL | 0 refills | Status: AC

## 2019-01-21 NOTE — ED Notes (Signed)
Patient transported to CT 

## 2019-01-21 NOTE — Discharge Instructions (Addendum)
Continue to take the naproxen and you can also add 2 extra strength Tylenol with it for pain control.  Start taking the prednisone and use the Robaxin for muscle relaxation.  Stop taking the Flexeril.  If you start having difficulty urinating or controlling your bowels return to the emergency room immediately.

## 2019-01-21 NOTE — ED Provider Notes (Signed)
[facility] EMERGENCY DEPARTMENT Provider Note   CSN: [id number] Arrival date & time: 01/21/19  1615     History   Chief Complaint Chief Complaint  Patient presents with  . Abdominal Pain    HPI Donald Poremina Wisener is a 38 y.o. female.     Patient is a 38 year old female with a history of migraines who is presenting today with back and abdominal pain.  Of note patient was having blood in her stool and had a negative CAT scan at the end of October and then [facility] and had a colonoscopy done on 01/07/2019.  The colonoscopy showed internal hemorrhoids but no other acute findings.  Approximately 1 week after colonoscopy patient was at work when she developed significant tenderness throughout her lower back which then radiated into her stomach and down her legs.  For the last 7 days she has had intense pain that is worse with certain movements and has not been getting better.  She did see her PCP on Monday and the PA prescribed naproxen and cyclobenzaprine which she was taking but it was not helping.  She then went to acupuncture yesterday with some improvement but continued to have pain today.  She called her PCP who recommended she come to the emergency room for further evaluation as she is having a lot of lower abdominal pain.  She denies fever, nausea, vomiting or bowel changes.  She has no urinary or vaginal symptoms.  She is sexually active with only her husband for the last 15 years and has an IUD in place.  She does have a history of having ovarian cyst but states this feels much worse.  She seems to be very stiff after laying down and trying to get up is very painful.  Even sometimes her legs will give out but she denies any numbness tingling or paresthesias.  She has no difficulty controlling her bowel or bladder.  The history is provided by the patient.  Abdominal Pain Pain location:  LLQ, RLQ and suprapubic Pain quality: aching, sharp and shooting   Pain radiates to:  Back  Pain severity:  Severe Onset quality:  Sudden Duration:  1 week Timing:  Constant Progression:  Waxing and waning Chronicity:  New Relieved by:  Nothing Worsened by:  Movement and position changes Ineffective treatments:  NSAIDs (Muscle relaxer and acupuncture) Associated symptoms: no anorexia, no constipation, no diarrhea, no dysuria, no fever, no hematuria, no nausea, no vaginal bleeding and no vaginal discharge   Risk factors comment:  Recent colonoscopy for GI bleeding which was internal hemorrhoids   Past Medical History:  Diagnosis Date  . Healthy adult   . Migraine   . Psoriasis 02/2016    Patient Active Problem List   Diagnosis Date Noted  . Menstrual migraine without status migrainosus, not intractable 08/22/2015  . Migraine with aura and without status migrainosus, not intractable 10/04/2014  . Normal labor and delivery 06/18/2013    Past Surgical History:  Procedure Laterality Date  . NO PAST SURGERIES       OB History    Gravida  2   Para  2   Term  2   Preterm      AB      Living  2     SAB      TAB      Ectopic      Multiple      Live Births  2  Home Medications    Prior to Admission medications   Medication Sig Start Date End Date Taking? Authorizing Provider  clobetasol cream (TEMOVATE) 0.05 %  01/08/16   [provider]  fluticasone (FLONASE) 50 MCG/ACT nasal spray USE 1 SPRAY IN EACH NOSTRIL ONCE A DAY NASALLY 09/19/14   [provider]  ibuprofen (ADVIL,MOTRIN) 600 MG tablet Take 1 tablet (600 mg total) by mouth every 6 (six) hours as needed. Patient not taking: Reported on 07/15/2016 09/12/14   Joycie Peek, PA-C  MELOXICAM PO Take by mouth.    [provider]  Naproxen Sodium (ALEVE) 220 MG CAPS Take by mouth daily as needed.    [provider]  rizatriptan (MAXALT-MLT) 10 MG disintegrating tablet Take 1 tablet (10 mg total) by mouth as needed for migraine. May repeat in 2  hours if needed Patient not taking: Reported on 07/15/2016 02/19/16   Penumalli, Glenford Bayley, MD  topiramate (TOPAMAX) 50 MG tablet Take 1 tablet (50 mg total) by mouth 2 (two) times daily. Patient not taking: Reported on 07/15/2016 02/19/16   Penumalli, Glenford Bayley, MD  traMADol (ULTRAM) 50 MG tablet Take 1 tablet (50 mg total) by mouth every 6 (six) hours as needed. Patient not taking: Reported on 07/15/2016 12/07/15   Carlene Coria, PA-C    Family History Family History  Problem Relation Age of Onset  . Cancer Mother        kidney  . Thyroid disease Father   . Ulcers Father   . Cancer Father        thyroid  . Hypertension Brother   . Ulcers Brother   . Hypertension Maternal Grandmother   . Breast cancer Paternal Aunt 54    Social History Social History   Tobacco Use  . Smoking status: Never Smoker  . Smokeless tobacco: Never Used  Substance Use Topics  . Alcohol use: No  . Drug use: No     Allergies   Clindamycin/lincomycin   Review of Systems Review of Systems  Constitutional: Negative for fever.  Gastrointestinal: Positive for abdominal pain. Negative for anorexia, constipation, diarrhea and nausea.  Genitourinary: Negative for dysuria, hematuria, vaginal bleeding and vaginal discharge.  All other systems reviewed and are negative.    Physical Exam Updated Vital Signs BP 118/85   Pulse 85   Temp 98.8 F (37.1 C) (Oral)   Resp 16   Ht  (1.727 m)   Wt 99.8 kg   SpO2 100%   BMI 33.45 kg/m   Physical Exam Vitals signs and nursing note reviewed.  Constitutional:      General: She is in acute distress.     Appearance: She is well-developed.  HENT:     Head: Normocephalic and atraumatic.  Eyes:     Pupils: Pupils are equal, round, and reactive to light.  Cardiovascular:     Rate and Rhythm: Normal rate and regular rhythm.     Heart sounds: Normal heart sounds. No murmur. No friction rub.  Pulmonary:     Effort: Pulmonary effort is normal.     Breath  sounds: Normal breath sounds. No wheezing or rales.  Abdominal:     General: Bowel sounds are normal. There is no distension.     Palpations: Abdomen is soft.     Tenderness: There is abdominal tenderness in the right lower quadrant, suprapubic area and left lower quadrant. There is no right CVA tenderness, left CVA tenderness, guarding or rebound.  Musculoskeletal: Normal range of  motion.        General: Tenderness present.       Back:     Comments: No edema.  Pain with palpation over bilateral sciatic notches  Skin:    General: Skin is warm and dry.     Findings: No rash.  Neurological:     General: No focal deficit present.     Mental Status: She is alert and oriented to person, place, and time.     Cranial Nerves: No cranial nerve deficit.     Comments: Positive straight leg raise on the left.  5 out of 5 strength in bilateral upper and lower extremities.  Sensation intact.  Normal foot plantar and dorsiflexion  Psychiatric:        Mood and Affect: Mood normal.        Behavior: Behavior normal.        Thought Content: Thought content normal.      ED Treatments / Results  Labs (all labs ordered are listed, but only abnormal results are displayed) Labs Reviewed  COMPREHENSIVE METABOLIC PANEL - Abnormal; Notable for the following components:      Result Value   Glucose, Bld 113 (*)    All other components within normal limits  URINALYSIS, ROUTINE W REFLEX MICROSCOPIC  PREGNANCY, URINE  CBC WITH DIFFERENTIAL/PLATELET    EKG None  Radiology Ct Abdomen Pelvis W Contrast  Result Date: 01/21/2019 CLINICAL DATA:  Abdominal pain EXAM: CT ABDOMEN AND PELVIS WITH CONTRAST TECHNIQUE: Multidetector CT imaging of the abdomen and pelvis was performed using the standard protocol following bolus administration of intravenous contrast. CONTRAST:  13mL OMNIPAQUE IOHEXOL 300 MG/ML  SOLN COMPARISON:  CT 12/08/2015 FINDINGS: Lower chest: No acute abnormality. Hepatobiliary: No focal liver  abnormality is seen. No gallstones, gallbladder wall thickening, or biliary dilatation. Pancreas: Unremarkable. No pancreatic ductal dilatation or surrounding inflammatory changes. Spleen: Normal in size without focal abnormality. Adrenals/Urinary Tract: Adrenal glands are unremarkable. Kidneys are normal, without renal calculi, focal lesion, or hydronephrosis. Bladder is unremarkable. Stomach/Bowel: Stomach is within normal limits. Appendix appears normal. No evidence of bowel wall thickening, distention, or inflammatory changes. Vascular/Lymphatic: No significant vascular findings are present. No enlarged abdominal or pelvic lymph nodes. Reproductive: IUD in the uterus.  No adnexal mass. Other: No abdominal wall hernia or abnormality. No abdominopelvic ascites. Musculoskeletal: No acute or significant osseous findings. IMPRESSION: Negative. No CT evidence for acute intra-abdominal or pelvic abnormality. Electronically Signed   By: Donavan Foil M.D.   On: 01/21/2019 19:16    Procedures Procedures (including critical care time)  Medications Ordered in ED Medications - No data to display   Initial Impression / Assessment and Plan / ED Course  I have reviewed the triage vital signs and the nursing notes.  Pertinent labs & imaging results that were available during my care of the patient were reviewed by me and considered in my medical decision making (see chart for details).        38 year old female presenting with atypical back and abdominal pain.  Patient symptoms seem most likely related to musculoskeletal cause such as bulging disc and sciatica however it is bilateral.  She does have abdominal tenderness in the lower abdomen as well on exam.  This could be all musculoskeletal however since she is recently had a colonoscopy within the last 2 weeks and pain started 1 week after the symptoms we will do a CT to rule out perforation, abscess or other acute findings.  Patient looks systemically  well.  Urine is within normal limits and UPT is negative.  Low suspicion for pelvic etiology as patient is very low risk.  Symptoms are not classic for torsion or kidney stone.  No symptoms to suggest cauda equina.  7:47 PM Labs and CT are within normal limits.  Will treat for lumbar strain and sciatica.  Patient started on prednisone will continue naproxen and will change to Robaxin as the cyclobenzaprine has not been helpful.  Patient will follow up with her PCP next week if symptoms continue.  She will avoid any heavy lifting.  She was given strict return precautions  Final Clinical Impressions(s) / ED Diagnoses   Final diagnoses:  Strain of lumbar region, initial encounter  Bilateral sciatica    ED Discharge Orders         Ordered    predniSONE (DELTASONE) 20 MG tablet  Daily     01/21/19 1949    methocarbamol (ROBAXIN) 500 MG tablet  2 times daily     01/21/19 1949           Gwyneth Sprout, MD 01/21/19 1949

## 2019-01-21 NOTE — ED Triage Notes (Addendum)
Pt c/o abd pain and lower back pain x 1 week. Neg covid test 11/12 colonoscopy 11/06

## 2019-02-01 ENCOUNTER — Other Ambulatory Visit: Payer: Self-pay | Admitting: Family Medicine

## 2019-02-01 DIAGNOSIS — M5416 Radiculopathy, lumbar region: Secondary | ICD-10-CM

## 2019-02-01 DIAGNOSIS — M545 Low back pain, unspecified: Secondary | ICD-10-CM

## 2019-02-01 DIAGNOSIS — M5430 Sciatica, unspecified side: Secondary | ICD-10-CM

## 2020-02-12 IMAGING — US ULTRASOUND RIGHT BREAST LIMITED
1 series · 4 of 4 positions shown · non-contrast
Comparison: None.

CLINICAL DATA: 37-year-old presenting with a 1 week history of
focal pain involving the INNER RIGHT breast. This is the patient's
initial baseline mammogram.

Family history of breast cancer in a paternal aunt.
EXAM:
DIGITAL DIAGNOSTIC BILATERAL MAMMOGRAM WITH CAD AND TOMO
ULTRASOUND RIGHT BREAST

[Series 1: ultrasound right breast limited · 0.08mm/px · 4 of 4 slices shown]
[im 1/4]
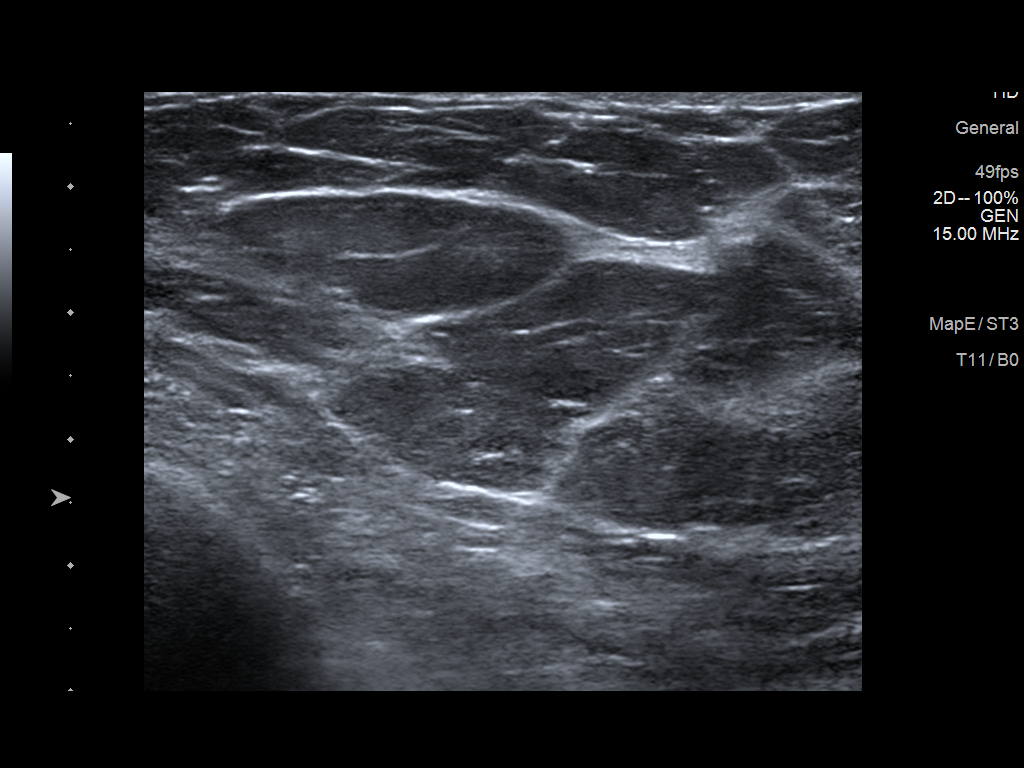
[im 2/4]
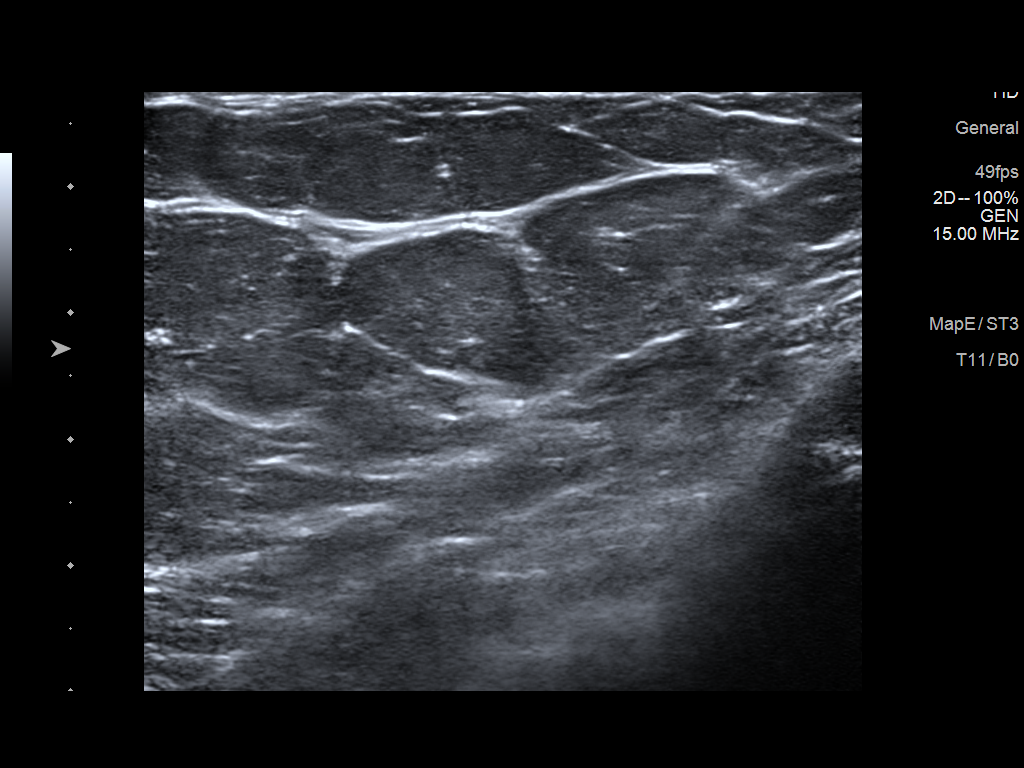
[im 3/4]
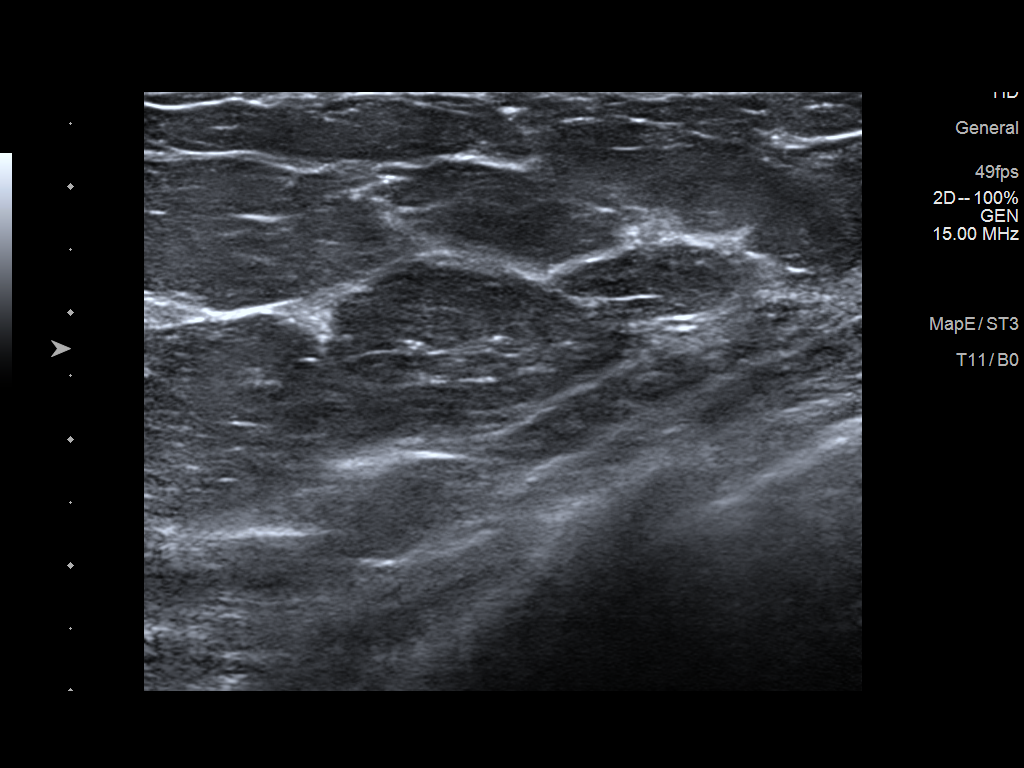
[im 4/4]
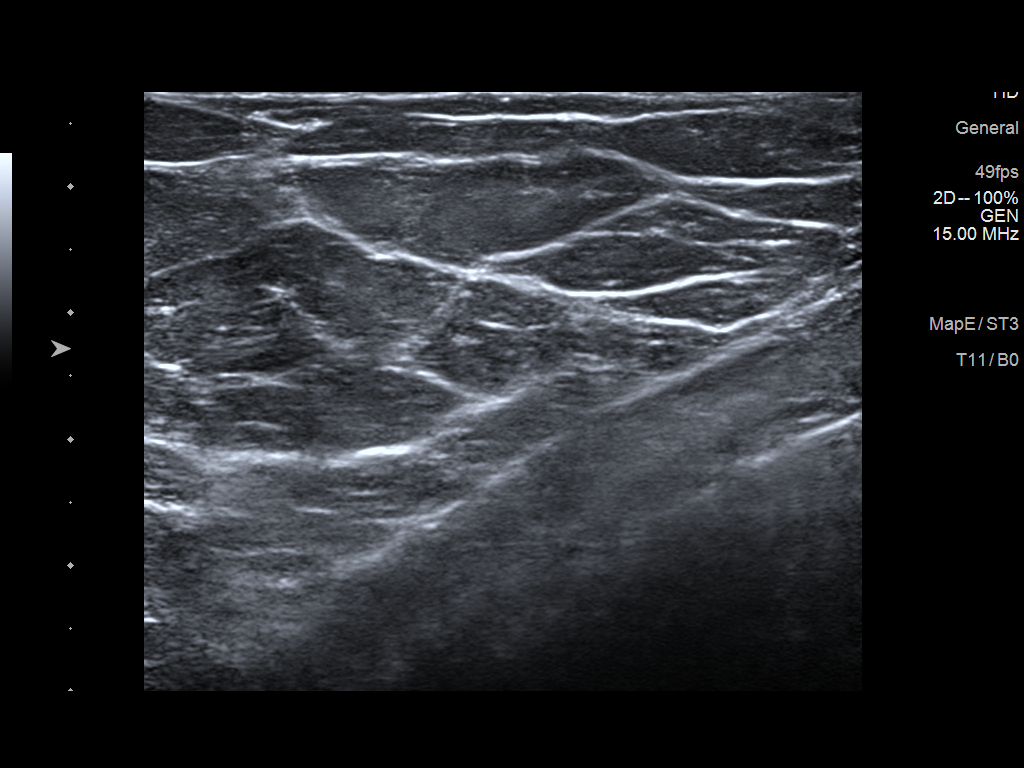

[4 of 4 positions shown; findings below may reference images not displayed]

ACR Breast Density Category b: There are scattered areas of
fibroglandular density.
FINDINGS: Tomosynthesis and synthesized full field CC and MLO views of both
breasts were obtained. Tomosynthesis and synthesized spot
compression tangential view of the area of concern in the RIGHT
breast was also obtained.

No mammographic abnormalities in the INNER RIGHT breast in the area
of focal pain. Normal scattered fibroglandular tissue is present in
this location.

No findings suspicious for malignancy in either breast.

Mammographic images were processed with CAD.

On physical exam, there is no palpable abnormality in the INNER
RIGHT breast. Patient describes tenderness palpation.

Targeted RIGHT breast ultrasound is performed, showing normal
scattered fibroglandular tissue in the INNER breast in the area of
focal pain at the 2-3 o'clock location. No cyst, solid mass or
abnormal acoustic shadowing is identified.
IMPRESSION: 1. No mammographic or sonographic evidence of malignancy involving
the RIGHT breast.
2. No mammographic evidence of malignancy involving the LEFT breast.

RECOMMENDATION:
Screening mammogram at age 40 unless there are persistent or
intervening clinical concerns. (Code:BI-6-FMW)

Strategies for alleviating breast pain including decreasing caffeine
intake, vitamin-E supplementation, and avoidance of tight
compression brassieres, including underwire brassieres, were
discussed with the patient.

I have discussed the findings and recommendations with the patient.
Results were also provided in writing at the conclusion of the
visit. If applicable, a reminder letter will be sent to the patient
regarding the next appointment.

BI-RADS CATEGORY  1: Negative.

## 2020-02-12 IMAGING — MG DIGITAL DIAGNOSTIC BILATERAL MAMMOGRAM WITH TOMO AND CAD
6 of 10 series · 6 of 30 positions shown · non-contrast
Comparison: None.

CLINICAL DATA: 37-year-old presenting with a 1 week history of
focal pain involving the INNER RIGHT breast. This is the patient's
initial baseline mammogram.

Family history of breast cancer in a paternal aunt.
EXAM:
DIGITAL DIAGNOSTIC BILATERAL MAMMOGRAM WITH CAD AND TOMO
ULTRASOUND RIGHT BREAST

[R CC synth-2D (1 of 2)]
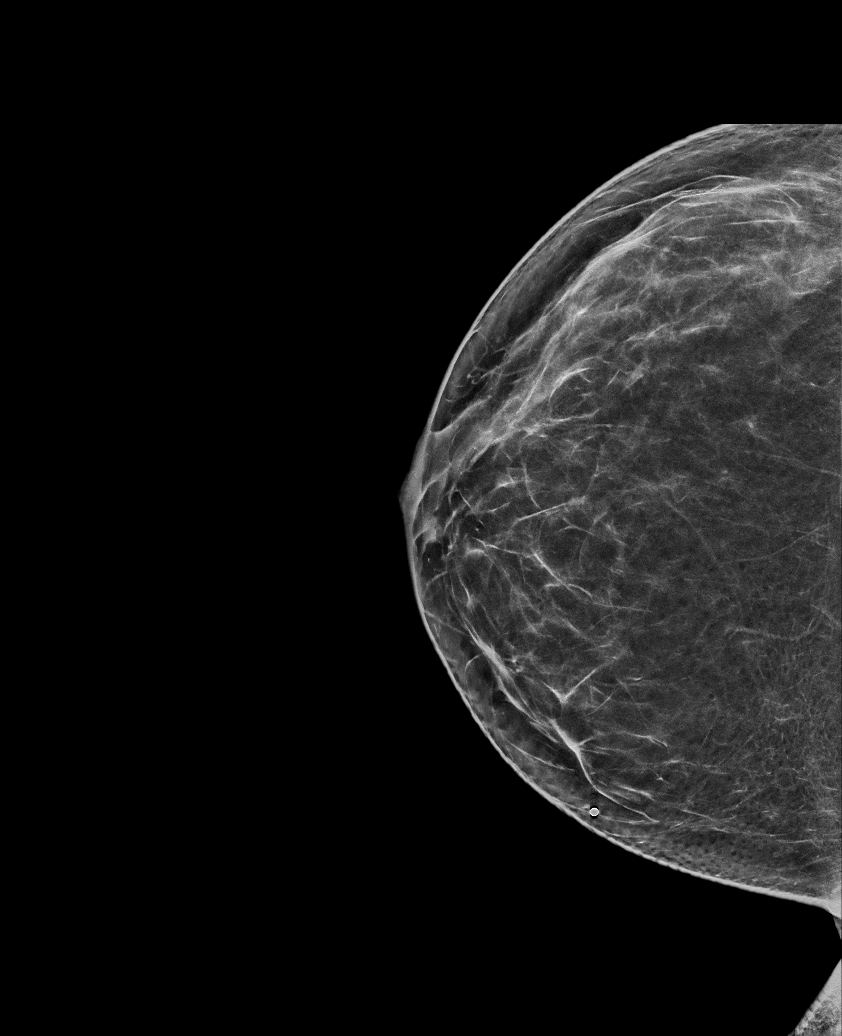

[R CC synth-2D (2 of 2)]
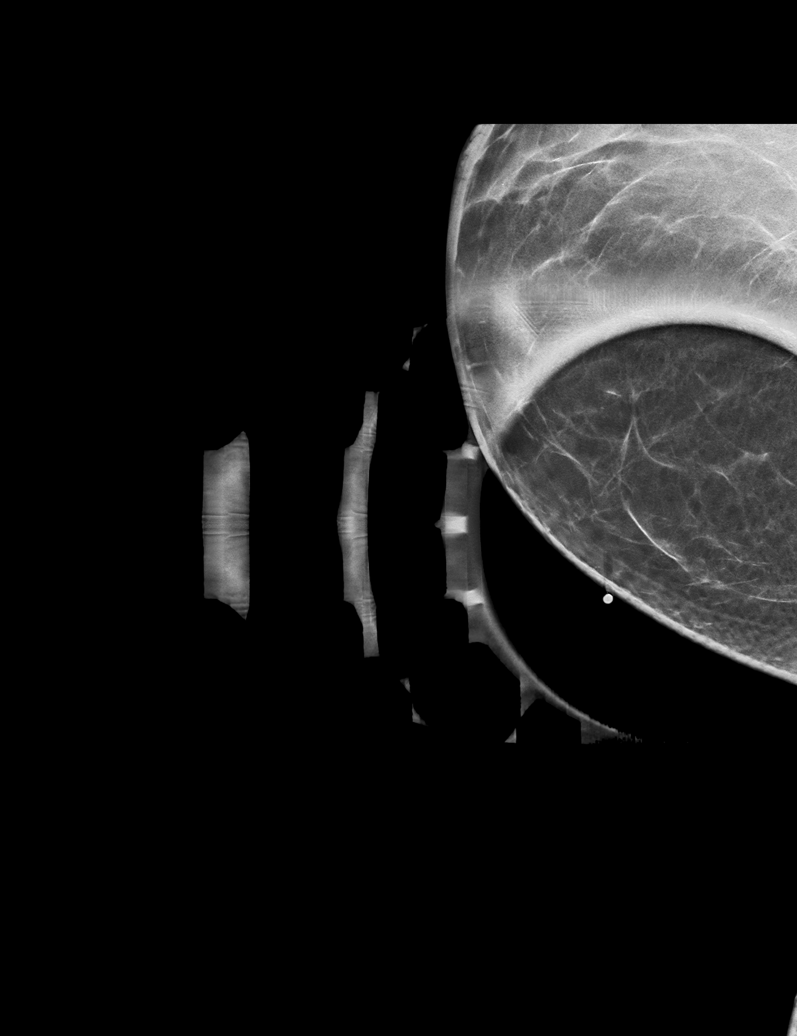

[R MLO synth-2D]
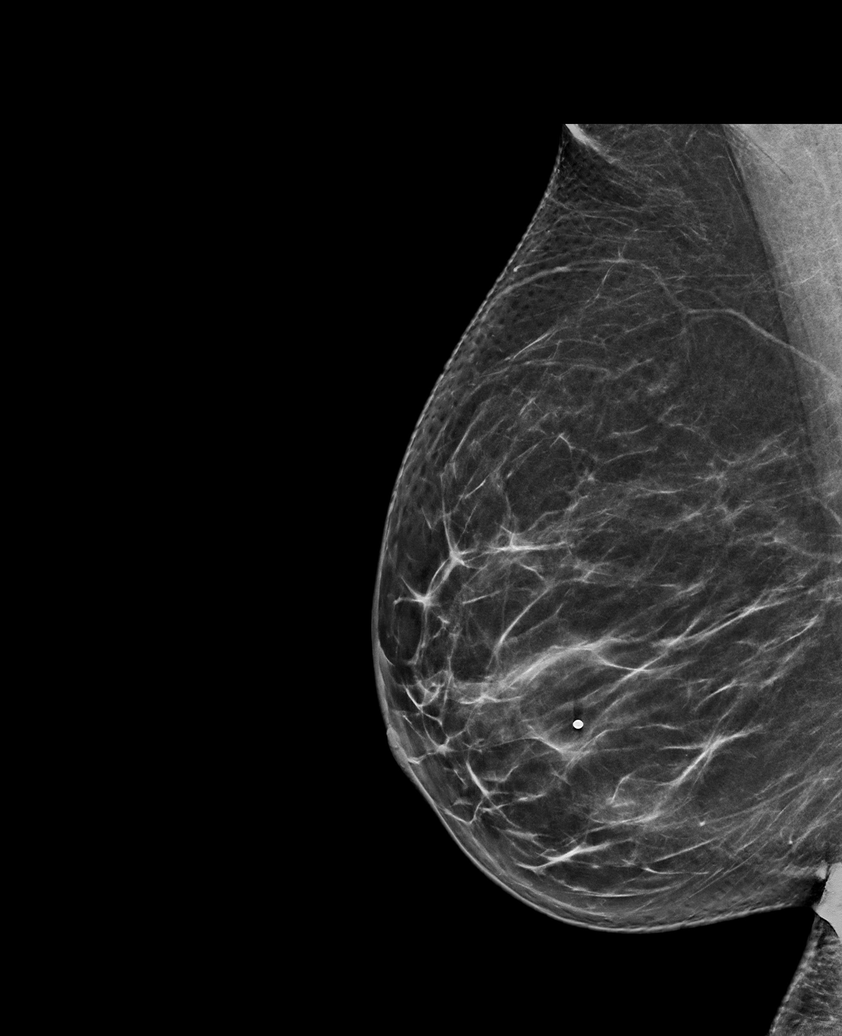

[L MLO synth-2D]
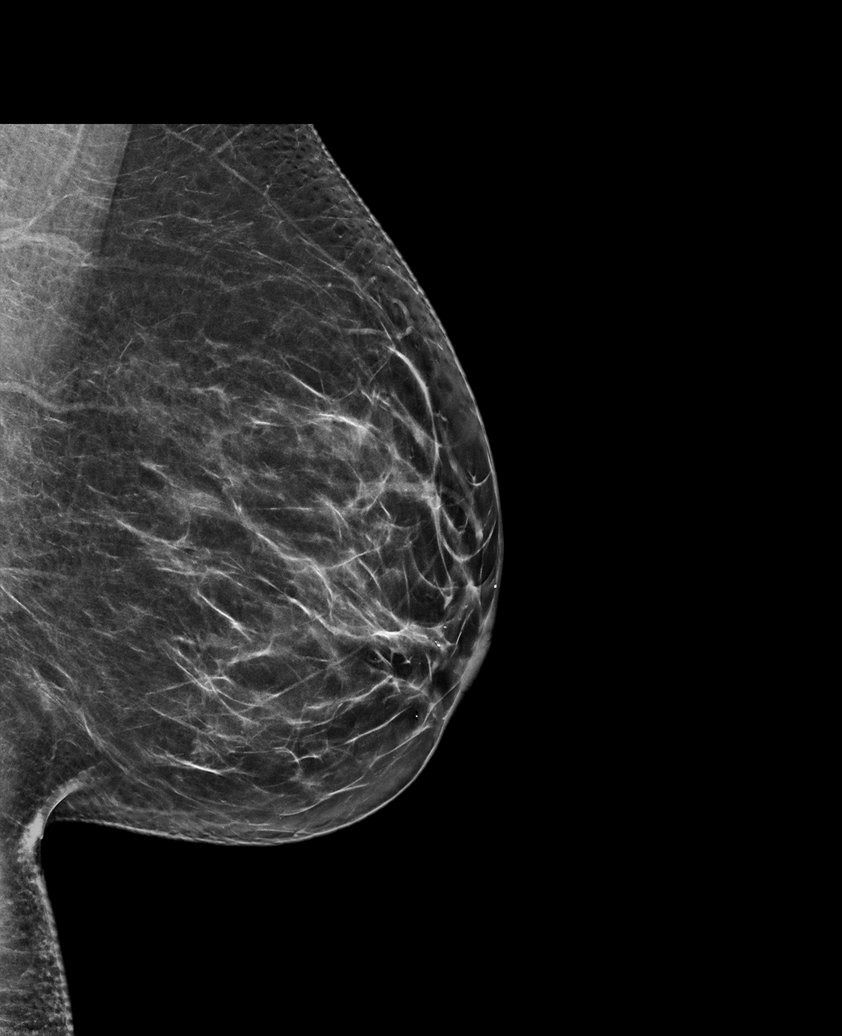

[L CC synth-2D]
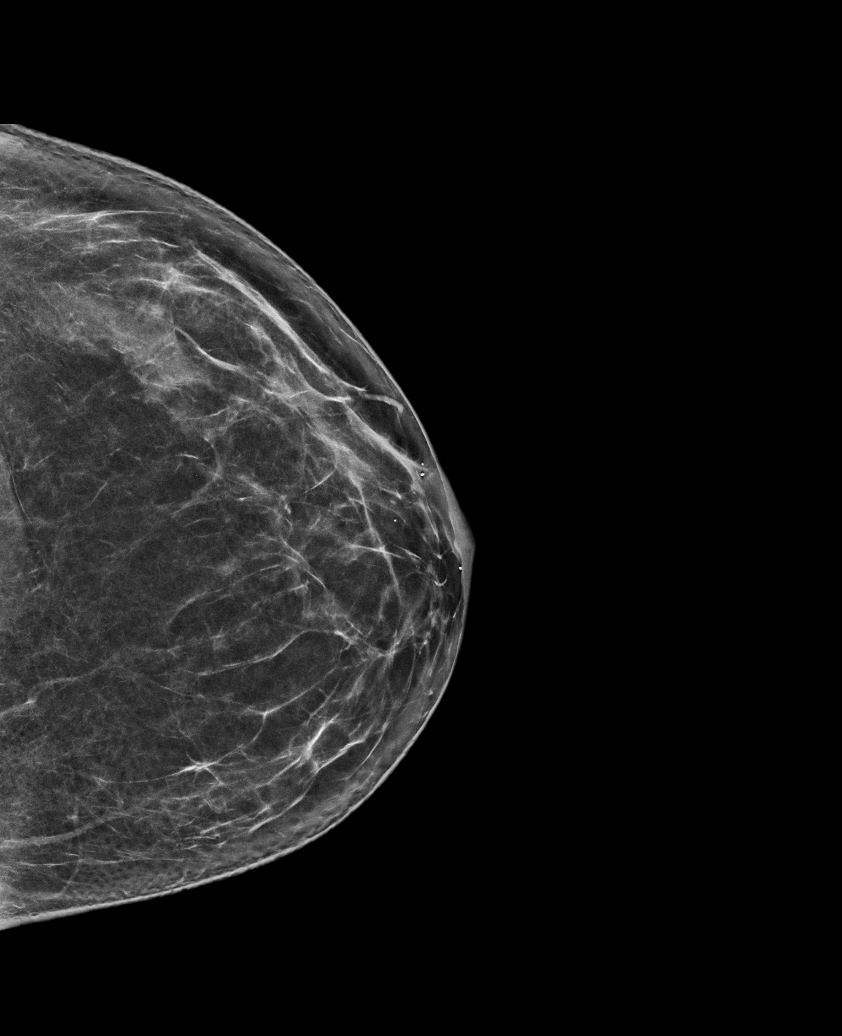

[L MLO tomo · tomo slice 38/75.0]
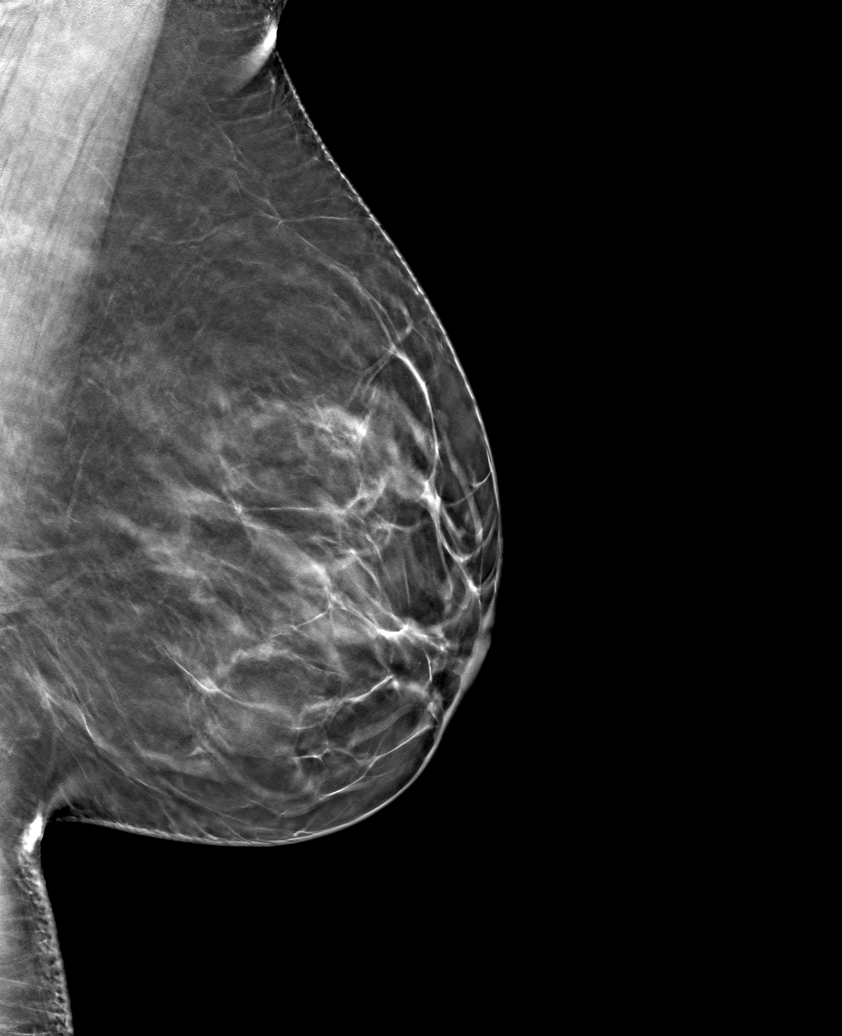

[6 of 30 positions shown; findings below may reference images not displayed]

ACR Breast Density Category b: There are scattered areas of
fibroglandular density.
FINDINGS: Tomosynthesis and synthesized full field CC and MLO views of both
breasts were obtained. Tomosynthesis and synthesized spot
compression tangential view of the area of concern in the RIGHT
breast was also obtained.

No mammographic abnormalities in the INNER RIGHT breast in the area
of focal pain. Normal scattered fibroglandular tissue is present in
this location.

No findings suspicious for malignancy in either breast.

Mammographic images were processed with CAD.

On physical exam, there is no palpable abnormality in the INNER
RIGHT breast. Patient describes tenderness palpation.

Targeted RIGHT breast ultrasound is performed, showing normal
scattered fibroglandular tissue in the INNER breast in the area of
focal pain at the 2-3 o'clock location. No cyst, solid mass or
abnormal acoustic shadowing is identified.
IMPRESSION: 1. No mammographic or sonographic evidence of malignancy involving
the RIGHT breast.
2. No mammographic evidence of malignancy involving the LEFT breast.

RECOMMENDATION:
Screening mammogram at age 40 unless there are persistent or
intervening clinical concerns. (Code:BI-6-FMW)

Strategies for alleviating breast pain including decreasing caffeine
intake, vitamin-E supplementation, and avoidance of tight
compression brassieres, including underwire brassieres, were
discussed with the patient.

I have discussed the findings and recommendations with the patient.
Results were also provided in writing at the conclusion of the
visit. If applicable, a reminder letter will be sent to the patient
regarding the next appointment.

BI-RADS CATEGORY  1: Negative.

## 2020-10-23 ENCOUNTER — Other Ambulatory Visit: Payer: Self-pay | Admitting: Family Medicine

## 2020-10-23 DIAGNOSIS — R221 Localized swelling, mass and lump, neck: Secondary | ICD-10-CM

## 2020-10-29 ENCOUNTER — Ambulatory Visit
Admission: RE | Admit: 2020-10-29 | Discharge: 2020-10-29 | Disposition: A | Discharge status: Home / Self Care | Payer: BC Managed Care – PPO | Source: Ambulatory Visit | Attending: Family Medicine | Admitting: Family Medicine

## 2020-10-29 DIAGNOSIS — R221 Localized swelling, mass and lump, neck: Secondary | ICD-10-CM

## 2021-05-17 DIAGNOSIS — R058 Other specified cough: Secondary | ICD-10-CM | POA: Diagnosis not present

## 2021-05-17 DIAGNOSIS — U071 COVID-19: Secondary | ICD-10-CM | POA: Diagnosis not present

## 2021-05-17 DIAGNOSIS — J069 Acute upper respiratory infection, unspecified: Secondary | ICD-10-CM | POA: Diagnosis not present

## 2021-05-21 DIAGNOSIS — U071 COVID-19: Secondary | ICD-10-CM | POA: Diagnosis not present

## 2021-05-21 DIAGNOSIS — J069 Acute upper respiratory infection, unspecified: Secondary | ICD-10-CM | POA: Diagnosis not present

## 2021-05-21 DIAGNOSIS — M5440 Lumbago with sciatica, unspecified side: Secondary | ICD-10-CM | POA: Diagnosis not present

## 2021-05-27 DIAGNOSIS — Z1151 Encounter for screening for human papillomavirus (HPV): Secondary | ICD-10-CM | POA: Diagnosis not present

## 2021-05-27 DIAGNOSIS — Z01419 Encounter for gynecological examination (general) (routine) without abnormal findings: Secondary | ICD-10-CM | POA: Diagnosis not present

## 2021-05-27 DIAGNOSIS — Z124 Encounter for screening for malignant neoplasm of cervix: Secondary | ICD-10-CM | POA: Diagnosis not present

## 2021-05-27 DIAGNOSIS — Z6835 Body mass index (BMI) 35.0-35.9, adult: Secondary | ICD-10-CM | POA: Diagnosis not present

## 2021-06-10 DIAGNOSIS — J0140 Acute pansinusitis, unspecified: Secondary | ICD-10-CM | POA: Diagnosis not present

## 2021-10-07 DIAGNOSIS — Z1231 Encounter for screening mammogram for malignant neoplasm of breast: Secondary | ICD-10-CM | POA: Diagnosis not present

## 2021-11-11 DIAGNOSIS — Z13220 Encounter for screening for lipoid disorders: Secondary | ICD-10-CM | POA: Diagnosis not present

## 2021-11-11 DIAGNOSIS — E559 Vitamin D deficiency, unspecified: Secondary | ICD-10-CM | POA: Diagnosis not present

## 2021-11-12 DIAGNOSIS — Z113 Encounter for screening for infections with a predominantly sexual mode of transmission: Secondary | ICD-10-CM | POA: Diagnosis not present

## 2021-11-12 DIAGNOSIS — N760 Acute vaginitis: Secondary | ICD-10-CM | POA: Diagnosis not present

## 2021-11-18 DIAGNOSIS — Z0000 Encounter for general adult medical examination without abnormal findings: Secondary | ICD-10-CM | POA: Diagnosis not present

## 2021-12-31 DIAGNOSIS — M5386 Other specified dorsopathies, lumbar region: Secondary | ICD-10-CM | POA: Diagnosis not present

## 2021-12-31 DIAGNOSIS — M9903 Segmental and somatic dysfunction of lumbar region: Secondary | ICD-10-CM | POA: Diagnosis not present

## 2021-12-31 DIAGNOSIS — M9902 Segmental and somatic dysfunction of thoracic region: Secondary | ICD-10-CM | POA: Diagnosis not present

## 2021-12-31 DIAGNOSIS — M9901 Segmental and somatic dysfunction of cervical region: Secondary | ICD-10-CM | POA: Diagnosis not present

## 2021-12-31 DIAGNOSIS — M9905 Segmental and somatic dysfunction of pelvic region: Secondary | ICD-10-CM | POA: Diagnosis not present

## 2022-01-01 DIAGNOSIS — M9903 Segmental and somatic dysfunction of lumbar region: Secondary | ICD-10-CM | POA: Diagnosis not present

## 2022-01-01 DIAGNOSIS — M5386 Other specified dorsopathies, lumbar region: Secondary | ICD-10-CM | POA: Diagnosis not present

## 2022-01-01 DIAGNOSIS — M9901 Segmental and somatic dysfunction of cervical region: Secondary | ICD-10-CM | POA: Diagnosis not present

## 2022-01-01 DIAGNOSIS — M9902 Segmental and somatic dysfunction of thoracic region: Secondary | ICD-10-CM | POA: Diagnosis not present

## 2022-01-06 DIAGNOSIS — M5386 Other specified dorsopathies, lumbar region: Secondary | ICD-10-CM | POA: Diagnosis not present

## 2022-01-06 DIAGNOSIS — M9902 Segmental and somatic dysfunction of thoracic region: Secondary | ICD-10-CM | POA: Diagnosis not present

## 2022-01-06 DIAGNOSIS — M9901 Segmental and somatic dysfunction of cervical region: Secondary | ICD-10-CM | POA: Diagnosis not present

## 2022-01-06 DIAGNOSIS — M9903 Segmental and somatic dysfunction of lumbar region: Secondary | ICD-10-CM | POA: Diagnosis not present

## 2022-01-07 DIAGNOSIS — M9901 Segmental and somatic dysfunction of cervical region: Secondary | ICD-10-CM | POA: Diagnosis not present

## 2022-01-07 DIAGNOSIS — M9902 Segmental and somatic dysfunction of thoracic region: Secondary | ICD-10-CM | POA: Diagnosis not present

## 2022-01-07 DIAGNOSIS — M9903 Segmental and somatic dysfunction of lumbar region: Secondary | ICD-10-CM | POA: Diagnosis not present

## 2022-01-07 DIAGNOSIS — M5386 Other specified dorsopathies, lumbar region: Secondary | ICD-10-CM | POA: Diagnosis not present

## 2022-01-13 DIAGNOSIS — M5386 Other specified dorsopathies, lumbar region: Secondary | ICD-10-CM | POA: Diagnosis not present

## 2022-01-13 DIAGNOSIS — M9902 Segmental and somatic dysfunction of thoracic region: Secondary | ICD-10-CM | POA: Diagnosis not present

## 2022-01-13 DIAGNOSIS — M9903 Segmental and somatic dysfunction of lumbar region: Secondary | ICD-10-CM | POA: Diagnosis not present

## 2022-01-13 DIAGNOSIS — M9901 Segmental and somatic dysfunction of cervical region: Secondary | ICD-10-CM | POA: Diagnosis not present

## 2022-01-14 DIAGNOSIS — M9903 Segmental and somatic dysfunction of lumbar region: Secondary | ICD-10-CM | POA: Diagnosis not present

## 2022-01-14 DIAGNOSIS — M9901 Segmental and somatic dysfunction of cervical region: Secondary | ICD-10-CM | POA: Diagnosis not present

## 2022-01-14 DIAGNOSIS — M5386 Other specified dorsopathies, lumbar region: Secondary | ICD-10-CM | POA: Diagnosis not present

## 2022-01-14 DIAGNOSIS — M9902 Segmental and somatic dysfunction of thoracic region: Secondary | ICD-10-CM | POA: Diagnosis not present

## 2022-01-20 DIAGNOSIS — M9902 Segmental and somatic dysfunction of thoracic region: Secondary | ICD-10-CM | POA: Diagnosis not present

## 2022-01-20 DIAGNOSIS — M9901 Segmental and somatic dysfunction of cervical region: Secondary | ICD-10-CM | POA: Diagnosis not present

## 2022-01-20 DIAGNOSIS — M9903 Segmental and somatic dysfunction of lumbar region: Secondary | ICD-10-CM | POA: Diagnosis not present

## 2022-01-20 DIAGNOSIS — M5386 Other specified dorsopathies, lumbar region: Secondary | ICD-10-CM | POA: Diagnosis not present

## 2022-01-21 DIAGNOSIS — M9902 Segmental and somatic dysfunction of thoracic region: Secondary | ICD-10-CM | POA: Diagnosis not present

## 2022-01-21 DIAGNOSIS — M9901 Segmental and somatic dysfunction of cervical region: Secondary | ICD-10-CM | POA: Diagnosis not present

## 2022-01-21 DIAGNOSIS — M9903 Segmental and somatic dysfunction of lumbar region: Secondary | ICD-10-CM | POA: Diagnosis not present

## 2022-01-21 DIAGNOSIS — M5386 Other specified dorsopathies, lumbar region: Secondary | ICD-10-CM | POA: Diagnosis not present

## 2022-01-27 DIAGNOSIS — M9902 Segmental and somatic dysfunction of thoracic region: Secondary | ICD-10-CM | POA: Diagnosis not present

## 2022-01-27 DIAGNOSIS — M5386 Other specified dorsopathies, lumbar region: Secondary | ICD-10-CM | POA: Diagnosis not present

## 2022-01-27 DIAGNOSIS — M9901 Segmental and somatic dysfunction of cervical region: Secondary | ICD-10-CM | POA: Diagnosis not present

## 2022-01-27 DIAGNOSIS — M9903 Segmental and somatic dysfunction of lumbar region: Secondary | ICD-10-CM | POA: Diagnosis not present

## 2022-01-28 DIAGNOSIS — M5386 Other specified dorsopathies, lumbar region: Secondary | ICD-10-CM | POA: Diagnosis not present

## 2022-01-28 DIAGNOSIS — M9903 Segmental and somatic dysfunction of lumbar region: Secondary | ICD-10-CM | POA: Diagnosis not present

## 2022-01-28 DIAGNOSIS — M9901 Segmental and somatic dysfunction of cervical region: Secondary | ICD-10-CM | POA: Diagnosis not present

## 2022-01-28 DIAGNOSIS — M9902 Segmental and somatic dysfunction of thoracic region: Secondary | ICD-10-CM | POA: Diagnosis not present

## 2022-02-03 DIAGNOSIS — M9901 Segmental and somatic dysfunction of cervical region: Secondary | ICD-10-CM | POA: Diagnosis not present

## 2022-02-03 DIAGNOSIS — M9902 Segmental and somatic dysfunction of thoracic region: Secondary | ICD-10-CM | POA: Diagnosis not present

## 2022-02-03 DIAGNOSIS — M5386 Other specified dorsopathies, lumbar region: Secondary | ICD-10-CM | POA: Diagnosis not present

## 2022-02-03 DIAGNOSIS — M9903 Segmental and somatic dysfunction of lumbar region: Secondary | ICD-10-CM | POA: Diagnosis not present

## 2022-02-04 DIAGNOSIS — M5386 Other specified dorsopathies, lumbar region: Secondary | ICD-10-CM | POA: Diagnosis not present

## 2022-02-04 DIAGNOSIS — M9901 Segmental and somatic dysfunction of cervical region: Secondary | ICD-10-CM | POA: Diagnosis not present

## 2022-02-04 DIAGNOSIS — M9902 Segmental and somatic dysfunction of thoracic region: Secondary | ICD-10-CM | POA: Diagnosis not present

## 2022-02-04 DIAGNOSIS — M9903 Segmental and somatic dysfunction of lumbar region: Secondary | ICD-10-CM | POA: Diagnosis not present

## 2022-02-05 DIAGNOSIS — T148XXA Other injury of unspecified body region, initial encounter: Secondary | ICD-10-CM | POA: Diagnosis not present

## 2022-02-05 DIAGNOSIS — M545 Low back pain, unspecified: Secondary | ICD-10-CM | POA: Diagnosis not present

## 2022-02-10 DIAGNOSIS — M9903 Segmental and somatic dysfunction of lumbar region: Secondary | ICD-10-CM | POA: Diagnosis not present

## 2022-02-10 DIAGNOSIS — M9902 Segmental and somatic dysfunction of thoracic region: Secondary | ICD-10-CM | POA: Diagnosis not present

## 2022-02-10 DIAGNOSIS — M9901 Segmental and somatic dysfunction of cervical region: Secondary | ICD-10-CM | POA: Diagnosis not present

## 2022-02-10 DIAGNOSIS — M5386 Other specified dorsopathies, lumbar region: Secondary | ICD-10-CM | POA: Diagnosis not present

## 2022-02-17 DIAGNOSIS — M9903 Segmental and somatic dysfunction of lumbar region: Secondary | ICD-10-CM | POA: Diagnosis not present

## 2022-02-17 DIAGNOSIS — M5386 Other specified dorsopathies, lumbar region: Secondary | ICD-10-CM | POA: Diagnosis not present

## 2022-02-17 DIAGNOSIS — M9901 Segmental and somatic dysfunction of cervical region: Secondary | ICD-10-CM | POA: Diagnosis not present

## 2022-02-17 DIAGNOSIS — M9902 Segmental and somatic dysfunction of thoracic region: Secondary | ICD-10-CM | POA: Diagnosis not present

## 2022-02-18 DIAGNOSIS — N760 Acute vaginitis: Secondary | ICD-10-CM | POA: Diagnosis not present

## 2022-02-18 DIAGNOSIS — M9903 Segmental and somatic dysfunction of lumbar region: Secondary | ICD-10-CM | POA: Diagnosis not present

## 2022-02-18 DIAGNOSIS — Z113 Encounter for screening for infections with a predominantly sexual mode of transmission: Secondary | ICD-10-CM | POA: Diagnosis not present

## 2022-02-18 DIAGNOSIS — M5386 Other specified dorsopathies, lumbar region: Secondary | ICD-10-CM | POA: Diagnosis not present

## 2022-02-18 DIAGNOSIS — M9901 Segmental and somatic dysfunction of cervical region: Secondary | ICD-10-CM | POA: Diagnosis not present

## 2022-02-18 DIAGNOSIS — M9902 Segmental and somatic dysfunction of thoracic region: Secondary | ICD-10-CM | POA: Diagnosis not present

## 2022-02-26 DIAGNOSIS — R051 Acute cough: Secondary | ICD-10-CM | POA: Diagnosis not present

## 2022-02-26 DIAGNOSIS — R0609 Other forms of dyspnea: Secondary | ICD-10-CM | POA: Diagnosis not present

## 2022-02-26 DIAGNOSIS — J069 Acute upper respiratory infection, unspecified: Secondary | ICD-10-CM | POA: Diagnosis not present

## 2022-02-26 DIAGNOSIS — Z03818 Encounter for observation for suspected exposure to other biological agents ruled out: Secondary | ICD-10-CM | POA: Diagnosis not present

## 2022-03-24 DIAGNOSIS — M9901 Segmental and somatic dysfunction of cervical region: Secondary | ICD-10-CM | POA: Diagnosis not present

## 2022-03-24 DIAGNOSIS — M5386 Other specified dorsopathies, lumbar region: Secondary | ICD-10-CM | POA: Diagnosis not present

## 2022-03-24 DIAGNOSIS — M9902 Segmental and somatic dysfunction of thoracic region: Secondary | ICD-10-CM | POA: Diagnosis not present

## 2022-03-24 DIAGNOSIS — M9903 Segmental and somatic dysfunction of lumbar region: Secondary | ICD-10-CM | POA: Diagnosis not present

## 2022-03-25 DIAGNOSIS — Z119 Encounter for screening for infectious and parasitic diseases, unspecified: Secondary | ICD-10-CM | POA: Diagnosis not present

## 2022-03-25 DIAGNOSIS — N761 Subacute and chronic vaginitis: Secondary | ICD-10-CM | POA: Diagnosis not present

## 2022-04-07 DIAGNOSIS — M9902 Segmental and somatic dysfunction of thoracic region: Secondary | ICD-10-CM | POA: Diagnosis not present

## 2022-04-07 DIAGNOSIS — M5386 Other specified dorsopathies, lumbar region: Secondary | ICD-10-CM | POA: Diagnosis not present

## 2022-04-07 DIAGNOSIS — M9901 Segmental and somatic dysfunction of cervical region: Secondary | ICD-10-CM | POA: Diagnosis not present

## 2022-04-07 DIAGNOSIS — M9903 Segmental and somatic dysfunction of lumbar region: Secondary | ICD-10-CM | POA: Diagnosis not present

## 2022-04-14 DIAGNOSIS — J069 Acute upper respiratory infection, unspecified: Secondary | ICD-10-CM | POA: Diagnosis not present

## 2022-04-21 DIAGNOSIS — M5386 Other specified dorsopathies, lumbar region: Secondary | ICD-10-CM | POA: Diagnosis not present

## 2022-04-21 DIAGNOSIS — M9903 Segmental and somatic dysfunction of lumbar region: Secondary | ICD-10-CM | POA: Diagnosis not present

## 2022-04-21 DIAGNOSIS — M9901 Segmental and somatic dysfunction of cervical region: Secondary | ICD-10-CM | POA: Diagnosis not present

## 2022-04-21 DIAGNOSIS — M9902 Segmental and somatic dysfunction of thoracic region: Secondary | ICD-10-CM | POA: Diagnosis not present

## 2022-04-29 DIAGNOSIS — M5451 Vertebrogenic low back pain: Secondary | ICD-10-CM | POA: Diagnosis not present

## 2022-05-06 DIAGNOSIS — M9902 Segmental and somatic dysfunction of thoracic region: Secondary | ICD-10-CM | POA: Diagnosis not present

## 2022-05-06 DIAGNOSIS — M9903 Segmental and somatic dysfunction of lumbar region: Secondary | ICD-10-CM | POA: Diagnosis not present

## 2022-05-06 DIAGNOSIS — M9901 Segmental and somatic dysfunction of cervical region: Secondary | ICD-10-CM | POA: Diagnosis not present

## 2022-05-06 DIAGNOSIS — M5386 Other specified dorsopathies, lumbar region: Secondary | ICD-10-CM | POA: Diagnosis not present

## 2022-05-20 DIAGNOSIS — M5451 Vertebrogenic low back pain: Secondary | ICD-10-CM | POA: Diagnosis not present

## 2022-05-26 DIAGNOSIS — M5451 Vertebrogenic low back pain: Secondary | ICD-10-CM | POA: Diagnosis not present

## 2022-06-02 DIAGNOSIS — M5451 Vertebrogenic low back pain: Secondary | ICD-10-CM | POA: Diagnosis not present

## 2022-06-03 DIAGNOSIS — M9902 Segmental and somatic dysfunction of thoracic region: Secondary | ICD-10-CM | POA: Diagnosis not present

## 2022-06-03 DIAGNOSIS — M9903 Segmental and somatic dysfunction of lumbar region: Secondary | ICD-10-CM | POA: Diagnosis not present

## 2022-06-03 DIAGNOSIS — M9901 Segmental and somatic dysfunction of cervical region: Secondary | ICD-10-CM | POA: Diagnosis not present

## 2022-06-03 DIAGNOSIS — M5386 Other specified dorsopathies, lumbar region: Secondary | ICD-10-CM | POA: Diagnosis not present

## 2022-06-10 DIAGNOSIS — Z6834 Body mass index (BMI) 34.0-34.9, adult: Secondary | ICD-10-CM | POA: Diagnosis not present

## 2022-06-10 DIAGNOSIS — Z01419 Encounter for gynecological examination (general) (routine) without abnormal findings: Secondary | ICD-10-CM | POA: Diagnosis not present

## 2022-06-10 DIAGNOSIS — Z124 Encounter for screening for malignant neoplasm of cervix: Secondary | ICD-10-CM | POA: Diagnosis not present

## 2022-06-10 DIAGNOSIS — Z1151 Encounter for screening for human papillomavirus (HPV): Secondary | ICD-10-CM | POA: Diagnosis not present

## 2022-06-12 DIAGNOSIS — M5451 Vertebrogenic low back pain: Secondary | ICD-10-CM | POA: Diagnosis not present

## 2022-07-15 DIAGNOSIS — M9901 Segmental and somatic dysfunction of cervical region: Secondary | ICD-10-CM | POA: Diagnosis not present

## 2022-07-15 DIAGNOSIS — M5386 Other specified dorsopathies, lumbar region: Secondary | ICD-10-CM | POA: Diagnosis not present

## 2022-07-15 DIAGNOSIS — M9903 Segmental and somatic dysfunction of lumbar region: Secondary | ICD-10-CM | POA: Diagnosis not present

## 2022-07-15 DIAGNOSIS — M9902 Segmental and somatic dysfunction of thoracic region: Secondary | ICD-10-CM | POA: Diagnosis not present

## 2022-08-11 DIAGNOSIS — M9902 Segmental and somatic dysfunction of thoracic region: Secondary | ICD-10-CM | POA: Diagnosis not present

## 2022-08-11 DIAGNOSIS — M9903 Segmental and somatic dysfunction of lumbar region: Secondary | ICD-10-CM | POA: Diagnosis not present

## 2022-08-11 DIAGNOSIS — M9901 Segmental and somatic dysfunction of cervical region: Secondary | ICD-10-CM | POA: Diagnosis not present

## 2022-08-11 DIAGNOSIS — M5386 Other specified dorsopathies, lumbar region: Secondary | ICD-10-CM | POA: Diagnosis not present

## 2022-10-13 DIAGNOSIS — Z1231 Encounter for screening mammogram for malignant neoplasm of breast: Secondary | ICD-10-CM | POA: Diagnosis not present

## 2022-11-25 DIAGNOSIS — Z0000 Encounter for general adult medical examination without abnormal findings: Secondary | ICD-10-CM | POA: Diagnosis not present

## 2022-11-25 DIAGNOSIS — Z13220 Encounter for screening for lipoid disorders: Secondary | ICD-10-CM | POA: Diagnosis not present

## 2022-11-25 DIAGNOSIS — M130 Polyarthritis, unspecified: Secondary | ICD-10-CM | POA: Diagnosis not present

## 2022-11-25 DIAGNOSIS — M79642 Pain in left hand: Secondary | ICD-10-CM | POA: Diagnosis not present

## 2022-11-25 DIAGNOSIS — M545 Low back pain, unspecified: Secondary | ICD-10-CM | POA: Diagnosis not present

## 2022-11-25 DIAGNOSIS — E559 Vitamin D deficiency, unspecified: Secondary | ICD-10-CM | POA: Diagnosis not present

## 2022-12-06 IMAGING — US US THYROID
1 series · 14 of 25 positions shown · non-contrast
Comparison: None.

CLINICAL DATA: Fullness of neck. F/H OF THYROID DISEASE. RIGHT LOBE
OF THYROID OF NECK IS PROMINENT

EXAM:
THYROID ULTRASOUND
TECHNIQUE: Ultrasound examination of the thyroid gland and adjacent soft
tissues was performed.

[Series 1: us thyroid · 0.06mm/px · 14 of 34 slices shown]
[im 1/34]
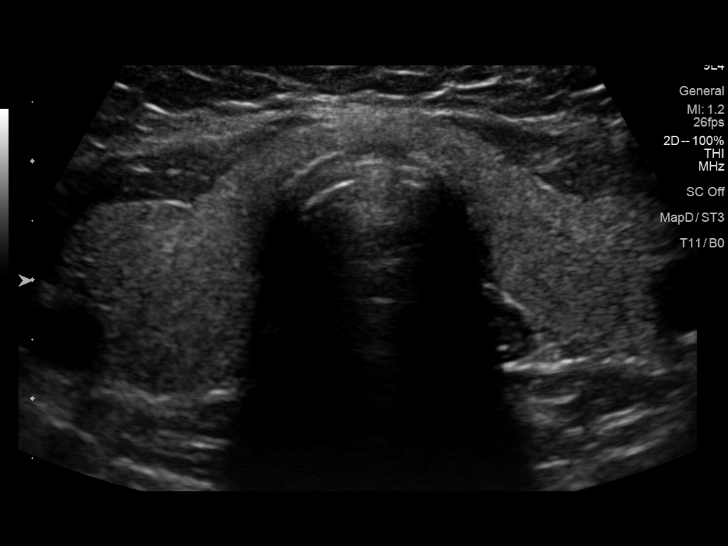
[im 3/34]
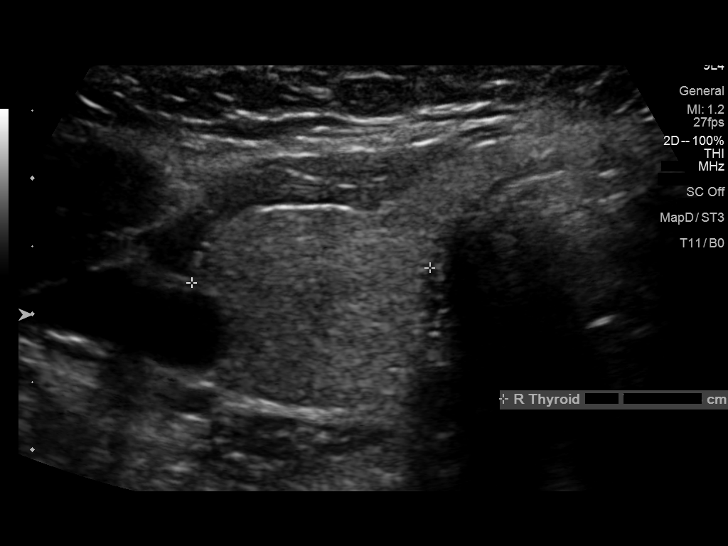
[im 6/34]
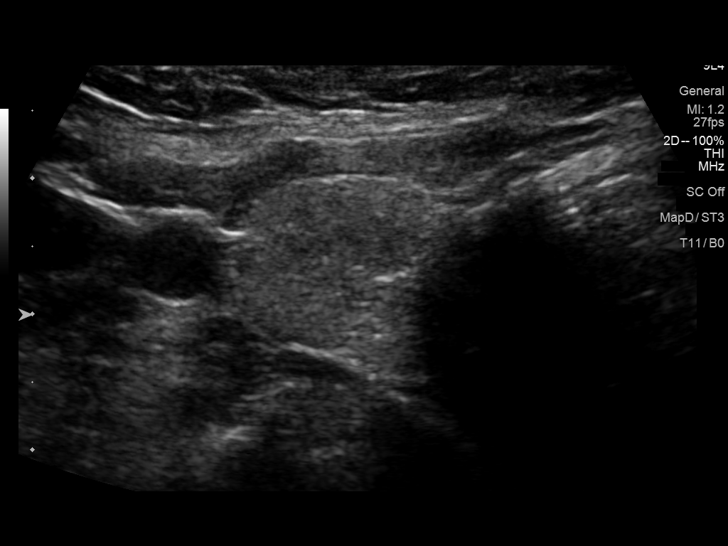
[im 9/34]
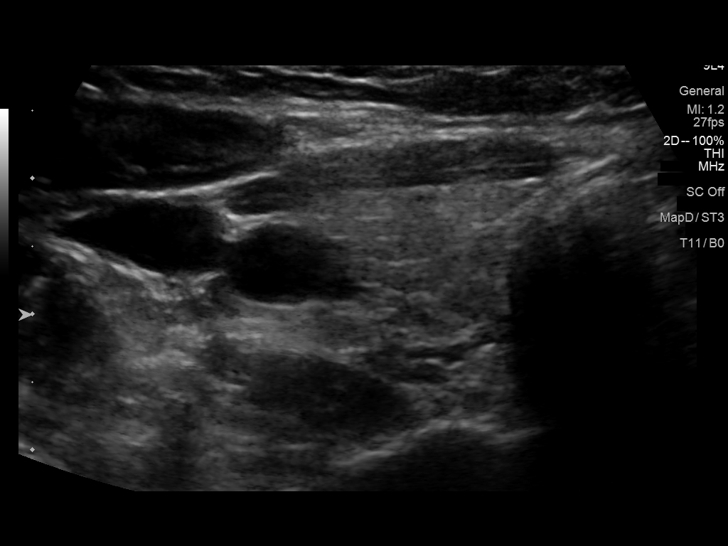
[im 12/34]
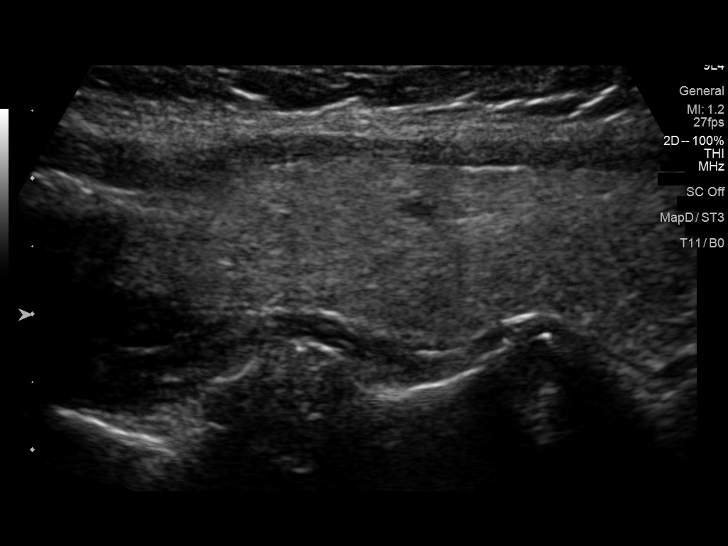
[im 13/34]
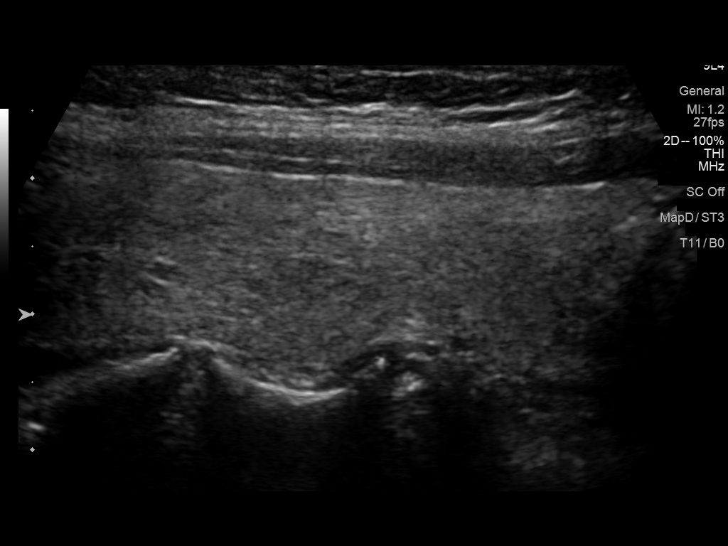
[im 16/34]
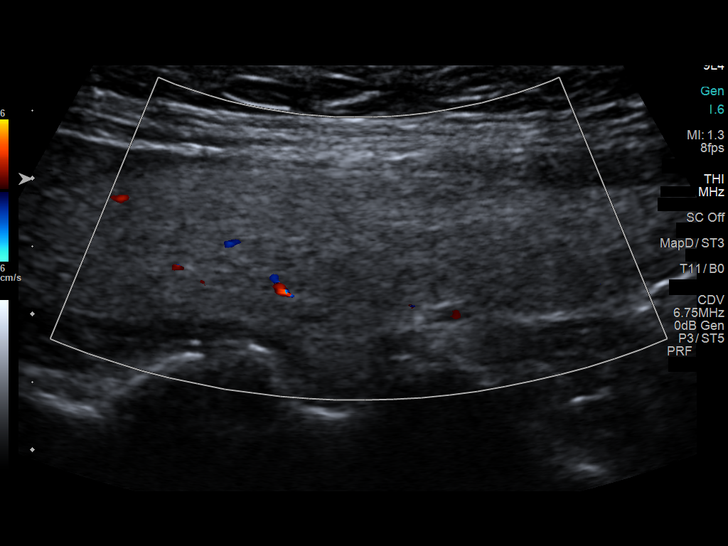
[im 18/34]
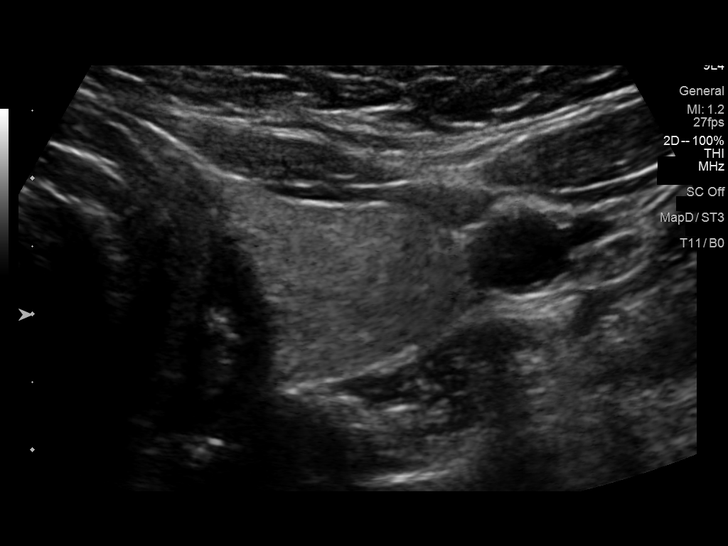
[im 21/34]
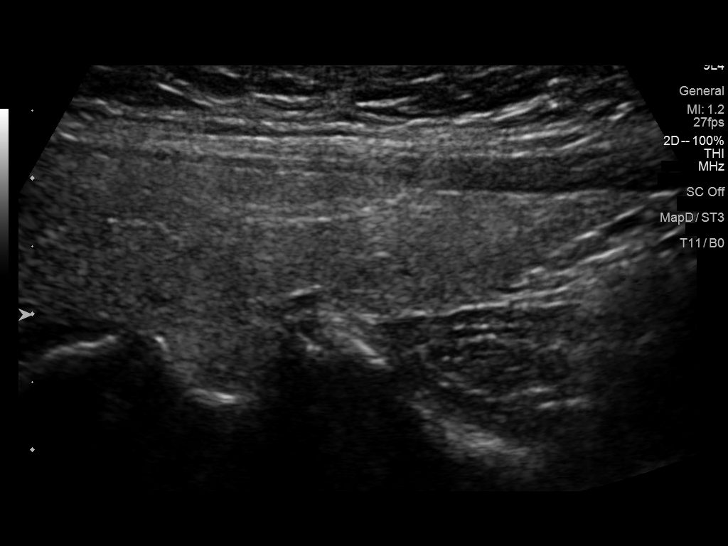
[im 23/34]
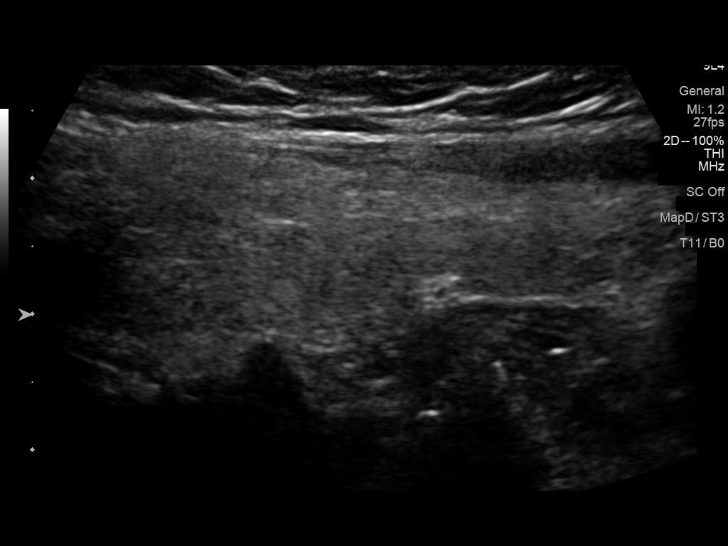
[im 25/34]
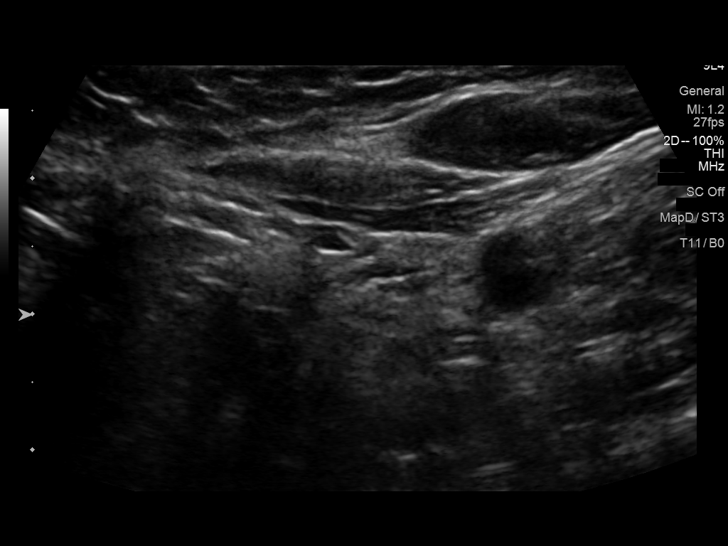
[im 28/34]
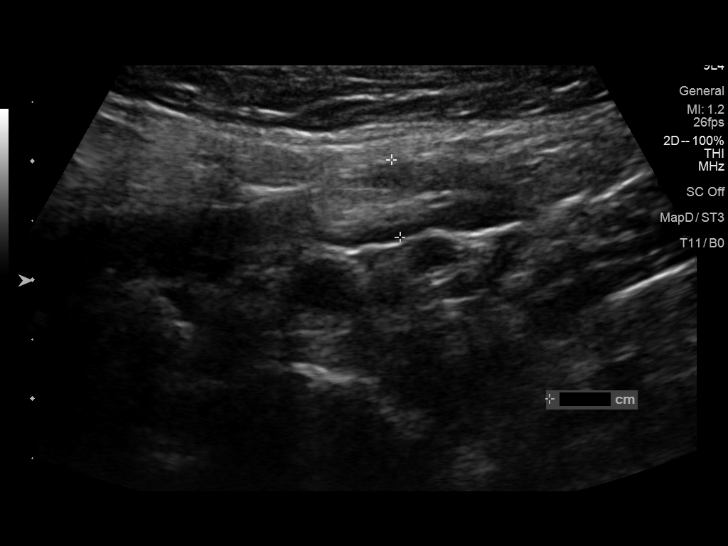
[im 31/34]
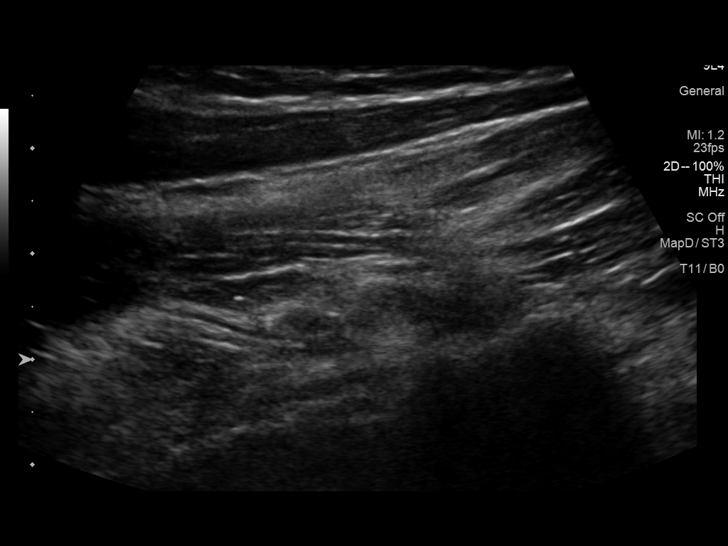
[im 34/34]
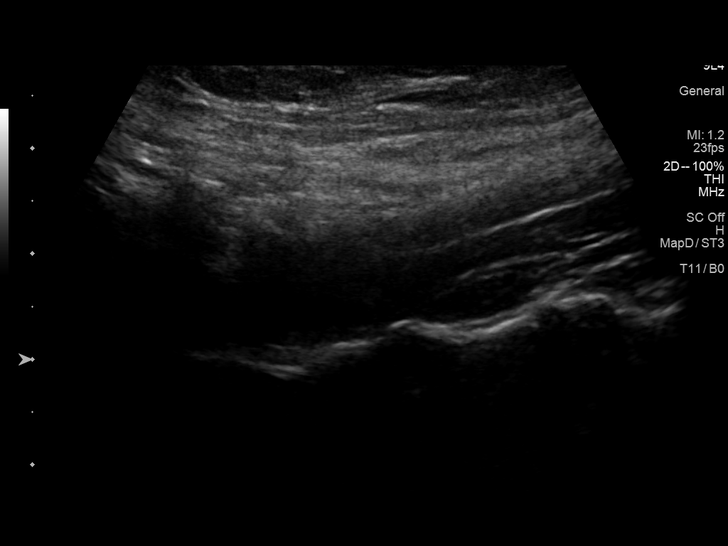

[14 of 25 positions shown; findings below may reference images not displayed]

FINDINGS: Parenchymal Echotexture: Normal.  Normal vascularity.

Isthmus: 0.4 cm

Right lobe: 5.5 x 1.2 x 1.8 cm

Left lobe: 5.2 x 1.5 x 1.8 cm

_________________________________________________________

Estimated total number of nodules >/= 1 cm: 0

Number of spongiform nodules >/=  2 cm not described below (TR1): 0

Number of mixed cystic and solid nodules >/= 1.5 cm not described
below (TR2): 0

_________________________________________________________

No discrete nodules are seen within the thyroid gland.
IMPRESSION: Thyroid gland measures at the upper limit of normal. No discrete
thyroid nodules identified.

The above is in keeping with the ACR TI-RADS recommendations - [HOSPITAL] 9997;[DATE].

## 2022-12-30 DIAGNOSIS — M9905 Segmental and somatic dysfunction of pelvic region: Secondary | ICD-10-CM | POA: Diagnosis not present

## 2022-12-30 DIAGNOSIS — M9903 Segmental and somatic dysfunction of lumbar region: Secondary | ICD-10-CM | POA: Diagnosis not present

## 2022-12-30 DIAGNOSIS — M9902 Segmental and somatic dysfunction of thoracic region: Secondary | ICD-10-CM | POA: Diagnosis not present

## 2022-12-30 DIAGNOSIS — M5386 Other specified dorsopathies, lumbar region: Secondary | ICD-10-CM | POA: Diagnosis not present

## 2022-12-30 DIAGNOSIS — M9901 Segmental and somatic dysfunction of cervical region: Secondary | ICD-10-CM | POA: Diagnosis not present

## 2023-01-06 DIAGNOSIS — M5386 Other specified dorsopathies, lumbar region: Secondary | ICD-10-CM | POA: Diagnosis not present

## 2023-01-06 DIAGNOSIS — M9902 Segmental and somatic dysfunction of thoracic region: Secondary | ICD-10-CM | POA: Diagnosis not present

## 2023-01-06 DIAGNOSIS — M9905 Segmental and somatic dysfunction of pelvic region: Secondary | ICD-10-CM | POA: Diagnosis not present

## 2023-01-06 DIAGNOSIS — M9903 Segmental and somatic dysfunction of lumbar region: Secondary | ICD-10-CM | POA: Diagnosis not present

## 2023-01-06 DIAGNOSIS — M9901 Segmental and somatic dysfunction of cervical region: Secondary | ICD-10-CM | POA: Diagnosis not present

## 2023-01-13 DIAGNOSIS — M9903 Segmental and somatic dysfunction of lumbar region: Secondary | ICD-10-CM | POA: Diagnosis not present

## 2023-01-13 DIAGNOSIS — M5386 Other specified dorsopathies, lumbar region: Secondary | ICD-10-CM | POA: Diagnosis not present

## 2023-01-13 DIAGNOSIS — M9902 Segmental and somatic dysfunction of thoracic region: Secondary | ICD-10-CM | POA: Diagnosis not present

## 2023-01-13 DIAGNOSIS — M9905 Segmental and somatic dysfunction of pelvic region: Secondary | ICD-10-CM | POA: Diagnosis not present

## 2023-01-13 DIAGNOSIS — M9901 Segmental and somatic dysfunction of cervical region: Secondary | ICD-10-CM | POA: Diagnosis not present

## 2023-01-20 DIAGNOSIS — M9903 Segmental and somatic dysfunction of lumbar region: Secondary | ICD-10-CM | POA: Diagnosis not present

## 2023-01-20 DIAGNOSIS — M9902 Segmental and somatic dysfunction of thoracic region: Secondary | ICD-10-CM | POA: Diagnosis not present

## 2023-01-20 DIAGNOSIS — M5386 Other specified dorsopathies, lumbar region: Secondary | ICD-10-CM | POA: Diagnosis not present

## 2023-01-20 DIAGNOSIS — M9905 Segmental and somatic dysfunction of pelvic region: Secondary | ICD-10-CM | POA: Diagnosis not present

## 2023-01-20 DIAGNOSIS — M9901 Segmental and somatic dysfunction of cervical region: Secondary | ICD-10-CM | POA: Diagnosis not present

## 2023-01-26 DIAGNOSIS — M9905 Segmental and somatic dysfunction of pelvic region: Secondary | ICD-10-CM | POA: Diagnosis not present

## 2023-01-26 DIAGNOSIS — M9901 Segmental and somatic dysfunction of cervical region: Secondary | ICD-10-CM | POA: Diagnosis not present

## 2023-01-26 DIAGNOSIS — M9902 Segmental and somatic dysfunction of thoracic region: Secondary | ICD-10-CM | POA: Diagnosis not present

## 2023-01-26 DIAGNOSIS — M5386 Other specified dorsopathies, lumbar region: Secondary | ICD-10-CM | POA: Diagnosis not present

## 2023-01-26 DIAGNOSIS — M9903 Segmental and somatic dysfunction of lumbar region: Secondary | ICD-10-CM | POA: Diagnosis not present

## 2023-02-03 DIAGNOSIS — M9903 Segmental and somatic dysfunction of lumbar region: Secondary | ICD-10-CM | POA: Diagnosis not present

## 2023-02-03 DIAGNOSIS — M9902 Segmental and somatic dysfunction of thoracic region: Secondary | ICD-10-CM | POA: Diagnosis not present

## 2023-02-03 DIAGNOSIS — M9905 Segmental and somatic dysfunction of pelvic region: Secondary | ICD-10-CM | POA: Diagnosis not present

## 2023-02-03 DIAGNOSIS — M5386 Other specified dorsopathies, lumbar region: Secondary | ICD-10-CM | POA: Diagnosis not present

## 2023-02-10 DIAGNOSIS — M9903 Segmental and somatic dysfunction of lumbar region: Secondary | ICD-10-CM | POA: Diagnosis not present

## 2023-02-10 DIAGNOSIS — M9902 Segmental and somatic dysfunction of thoracic region: Secondary | ICD-10-CM | POA: Diagnosis not present

## 2023-02-10 DIAGNOSIS — M5386 Other specified dorsopathies, lumbar region: Secondary | ICD-10-CM | POA: Diagnosis not present

## 2023-02-10 DIAGNOSIS — M9905 Segmental and somatic dysfunction of pelvic region: Secondary | ICD-10-CM | POA: Diagnosis not present

## 2023-02-17 DIAGNOSIS — M9903 Segmental and somatic dysfunction of lumbar region: Secondary | ICD-10-CM | POA: Diagnosis not present

## 2023-02-17 DIAGNOSIS — M9905 Segmental and somatic dysfunction of pelvic region: Secondary | ICD-10-CM | POA: Diagnosis not present

## 2023-02-17 DIAGNOSIS — M9902 Segmental and somatic dysfunction of thoracic region: Secondary | ICD-10-CM | POA: Diagnosis not present

## 2023-02-17 DIAGNOSIS — M5386 Other specified dorsopathies, lumbar region: Secondary | ICD-10-CM | POA: Diagnosis not present

## 2023-12-19 ENCOUNTER — Encounter (HOSPITAL_BASED_OUTPATIENT_CLINIC_OR_DEPARTMENT_OTHER): Payer: Self-pay

## 2023-12-19 ENCOUNTER — Other Ambulatory Visit: Payer: Self-pay

## 2023-12-19 ENCOUNTER — Emergency Department (HOSPITAL_BASED_OUTPATIENT_CLINIC_OR_DEPARTMENT_OTHER)
Admission: EM | Admit: 2023-12-19 | Discharge: 2023-12-19 | Disposition: A | Discharge status: Home / Self Care | Attending: Emergency Medicine | Admitting: Emergency Medicine

## 2023-12-19 ENCOUNTER — Emergency Department (HOSPITAL_BASED_OUTPATIENT_CLINIC_OR_DEPARTMENT_OTHER)

## 2023-12-19 DIAGNOSIS — R0789 Other chest pain: Secondary | ICD-10-CM | POA: Diagnosis present

## 2023-12-19 LAB — CBC
HCT: 41.3 % (ref 36.0–46.0)
Hemoglobin: 14.5 g/dL (ref 12.0–15.0)
MCH: 30.4 pg (ref 26.0–34.0)
MCHC: 35.1 g/dL (ref 30.0–36.0)
MCV: 86.6 fL (ref 80.0–100.0)
Platelets: 274 K/uL (ref 150–400)
RBC: 4.77 MIL/uL (ref 3.87–5.11)
RDW: 11.9 % (ref 11.5–15.5)
WBC: 7 K/uL (ref 4.0–10.5)
nRBC: 0 % (ref 0.0–0.2)

## 2023-12-19 LAB — HEPATIC FUNCTION PANEL
ALT: 25 U/L (ref 0–44)
AST: 27 U/L (ref 15–41)
Albumin: 4.8 g/dL (ref 3.5–5.0)
Alkaline Phosphatase: 63 U/L (ref 38–126)
Bilirubin, Direct: 0.2 mg/dL (ref 0.0–0.2)
Indirect Bilirubin: 0.3 mg/dL (ref 0.3–0.9)
Total Bilirubin: 0.5 mg/dL (ref 0.0–1.2)
Total Protein: 7.8 g/dL (ref 6.5–8.1)

## 2023-12-19 LAB — BASIC METABOLIC PANEL WITH GFR
Anion gap: 16 — ABNORMAL HIGH (ref 5–15)
BUN: 12 mg/dL (ref 6–20)
CO2: 19 mmol/L — ABNORMAL LOW (ref 22–32)
Calcium: 9.9 mg/dL (ref 8.9–10.3)
Chloride: 102 mmol/L (ref 98–111)
Creatinine, Ser: 0.69 mg/dL (ref 0.44–1.00)
GFR, Estimated: >60 mL/min (ref >60)
Glucose, Bld: 88 mg/dL (ref 70–99)
Potassium: 3.8 mmol/L (ref 3.5–5.1)
Sodium: 137 mmol/L (ref 135–145)

## 2023-12-19 LAB — D-DIMER, QUANTITATIVE: D-Dimer, Quant: <0.27 ug{FEU}/mL (ref 0.00–0.50)

## 2023-12-19 LAB — LIPASE, BLOOD: Lipase: 27 U/L (ref 11–51)

## 2023-12-19 LAB — TROPONIN T, HIGH SENSITIVITY
Troponin T High Sensitivity: <15 ng/L (ref 0–19)
Troponin T High Sensitivity: <15 ng/L (ref 0–19)

## 2023-12-19 LAB — PREGNANCY, URINE: Preg Test, Ur: NEGATIVE

## 2023-12-19 MED ORDER — KETOROLAC TROMETHAMINE 30 MG/ML IJ SOLN
30.0000 mg | Freq: Once | INTRAMUSCULAR | Status: AC
  Administered 2023-12-19: 30 mg via INTRAMUSCULAR

## 2023-12-19 MED ORDER — SODIUM CHLORIDE 0.9 % IV BOLUS: 1000.0000 mL | Freq: Once | INTRAVENOUS | Status: DC

## 2023-12-19 MED ORDER — KETOROLAC TROMETHAMINE 30 MG/ML IJ SOLN
30.0000 mg | Freq: Once | INTRAMUSCULAR | Status: DC
  Filled 2023-12-19: qty 1

## 2023-12-19 NOTE — ED Provider Notes (Signed)
 Emergency Department Provider Note   I have reviewed the triage vital signs and the nursing notes.   HISTORY  Chief Complaint Chest Pain   HPI Melissa [name] is a 43 y.o. female with past history reviewed below presents emergency department with left sided anterior chest pain worse with taking a deep breath.  Some symptoms radiating to the left arm.  No known injury.  She states when taking a normal breath she feels okay but with an exaggerated, deep breath she has pain which feels like a tightness just left of the center of her chest.  No exertional symptoms.  No nausea or vomiting.  No diaphoresis.  No history of similar in the past.  She is not having unilateral leg pain or swelling.  No recent travel, surgeries.  She notes a cousin who died from a PE but this was apparently thought to be provoked by a cast on the leg. She has also been struggling with intermittent vertigo symptoms treated by her PCP.  Meclizine is improving symptoms.    Past Medical History:  Diagnosis Date   Healthy adult    Migraine    Psoriasis 02/2016    Review of Systems  Constitutional: No fever/chills Cardiovascular: Positive chest pain. Respiratory: Denies shortness of breath. Gastrointestinal: No abdominal pain.  No nausea, no vomiting.  Skin: Negative for rash. Neurological: Negative for headaches, focal weakness or numbness.   ____________________________________________   PHYSICAL EXAM:  VITAL SIGNS: ED Triage Vitals  Encounter Vitals Group     BP 12/19/23 1036 (!) 134/95     Pulse Rate 12/19/23 1036 84     Resp 12/19/23 1036 20     Temp 12/19/23 1036 (!) 97.5 F (36.4 C)     Temp Source 12/19/23 1036 Oral     SpO2 12/19/23 1036 100 %   Constitutional: Alert and oriented. Well appearing and in no acute distress. Eyes: Conjunctivae are normal.  Head: Atraumatic. Nose: No congestion/rhinnorhea. Mouth/Throat: Mucous membranes are moist.   Neck: No stridor. Cardiovascular:  Normal rate, regular rhythm. Good peripheral circulation. Grossly normal heart sounds.   Respiratory: Normal respiratory effort.  No retractions. Lungs CTAB. Gastrointestinal: Soft and nontender. No distention.  Musculoskeletal: No lower extremity tenderness nor edema. No gross deformities of extremities.  Tenderness to palpation along the left sternal border without crepitus. Neurologic:  Normal speech and language. No gross focal neurologic deficits are appreciated.  Skin:  Skin is warm, dry and intact. No rash noted.  ____________________________________________   LABS (all labs ordered are listed, but only abnormal results are displayed)  Labs Reviewed  BASIC METABOLIC PANEL WITH GFR - Abnormal; Notable for the following components:      Result Value   CO2 19 (*)    Anion gap 16 (*)    All other components within normal limits  CBC  PREGNANCY, URINE  HEPATIC FUNCTION PANEL  LIPASE, BLOOD  D-DIMER, QUANTITATIVE  TROPONIN T, HIGH SENSITIVITY  TROPONIN T, HIGH SENSITIVITY   ____________________________________________  EKG   EKG Interpretation Date/Time:  Saturday December 19 2023 10:38:53 EDT Ventricular Rate:  81 PR Interval:  185 QRS Duration:  97 QT Interval:  390 QTC Calculation: 453 R Axis:   42  Text Interpretation: Sinus rhythm Low voltage, precordial leads Confirmed by Darra Chew [phone]) on 12/19/2023 11:14:41 AM        ____________________________________________  RADIOLOGY  DG Chest 2 View Result Date: 12/19/2023 CLINICAL DATA:  Left-sided chest pain. EXAM: CHEST - 2 VIEW COMPARISON:  10/11/2020 FINDINGS: The heart size and mediastinal contours are within normal limits. Both lungs are clear. The visualized skeletal structures are unremarkable. IMPRESSION: No active cardiopulmonary disease. Electronically Signed   By: Camellia Candle M.D.   On: 12/19/2023 11:27    ____________________________________________   PROCEDURES  Procedure(s) performed:    Procedures  None  ____________________________________________   INITIAL IMPRESSION / ASSESSMENT AND PLAN / ED COURSE  Pertinent labs & imaging results that were available during my care of the patient were reviewed by me and considered in my medical decision making (see chart for details).   This patient is Presenting for Evaluation of CP, which does require a range of treatment options, and is a complaint that involves a high risk of morbidity and mortality.  The Differential Diagnoses includes but is not exclusive to acute coronary syndrome, aortic dissection, pulmonary embolism, cardiac tamponade, community-acquired pneumonia, pericarditis, musculoskeletal chest wall pain, etc.   Critical Interventions-    Medications  ketorolac  (TORADOL ) 30 MG/ML injection 30 mg (30 mg Intramuscular Given 12/19/23 1210)    Reassessment after intervention: pain improved.    Clinical Laboratory Tests Ordered, included troponin negative.  D-dimer negative.  No acute kidney injury.  LFTs and bilirubin normal.  No leukocytosis.  Radiologic Tests Ordered, included CXR. I independently interpreted the images and agree with radiology interpretation.   Cardiac Monitor Tracing which shows NSR.   Medical Decision Making: Summary:  Patient presents to the emergency department for evaluation of chest pain.  Seems most likely musculoskeletal and is reproducible with palpation of the chest wall.  Plan for D-dimer and delta troponin with intermittent symptoms. Low risk for PE by [place].   Reevaluation with update and discussion with patient. Second troponin negative. Plan for discharge home with PCP follow up plan.   Considered admission but ED workup is reassuring.   Patient's presentation is most consistent with acute presentation with potential threat to life or bodily function.   Disposition: discharge  ____________________________________________  FINAL CLINICAL IMPRESSION(S) / ED  DIAGNOSES  Final diagnoses:  Atypical chest pain    Note:  This document was prepared using Dragon voice recognition software and may include unintentional dictation errors.  Fonda Law, MD, [facility]    Octivia Canion, Fonda MATSU, MD 12/20/23 [phone]

## 2023-12-19 NOTE — ED Triage Notes (Signed)
 Reports L sided chest pain when inhaling, SHOB, fatigue for 1.5 hours. Radiates to L arm  Denies N/V   Took 162.5 mg aspirin 30 min

## 2023-12-19 NOTE — Discharge Instructions (Signed)
 You have been seen in the Emergency Department (ED) today for chest pain.  As we have discussed today's test results are normal, and we believe your pain is due to pain/strain and/or inflammation of the muscles and/or cartilage of your chest wall.  We recommend you take ibuprofen  600-800 mg three times a day with meals for the next 5 days (unless you have been told previously not to take ibuprofen  or NSAIDs in general).  You may also take Tylenol  according to the label instructions.  Read through the included information for additional treatment recommendations and precautions.  Continue to take your regular medications.   Return to the Emergency Department (ED) if you experience any further chest pain/pressure/tightness, difficulty breathing, or sudden sweating, or other symptoms that concern you.

## 2023-12-19 NOTE — ED Notes (Signed)
 ED Provider at bedside.
